# Patient Record
Sex: Female | Born: 1960
Health system: Southern US, Community
[De-identification: ages and names within clinical notes are randomized; demographics above are authoritative.]

## PROBLEM LIST (undated history)

## (undated) ENCOUNTER — Emergency Department (HOSPITAL_BASED_OUTPATIENT_CLINIC_OR_DEPARTMENT_OTHER): Admission: EM | Payer: 59

## (undated) DIAGNOSIS — E785 Hyperlipidemia, unspecified: Secondary | ICD-10-CM

## (undated) DIAGNOSIS — G43909 Migraine, unspecified, not intractable, without status migrainosus: Secondary | ICD-10-CM

## (undated) DIAGNOSIS — C801 Malignant (primary) neoplasm, unspecified: Secondary | ICD-10-CM

## (undated) HISTORY — DX: Migraine, unspecified, not intractable, without status migrainosus: G43.909

## (undated) HISTORY — DX: Hyperlipidemia, unspecified: E78.5

## (undated) HISTORY — DX: Malignant (primary) neoplasm, unspecified: C80.1

---
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
## 1990-05-21 HISTORY — PX: ABDOMINAL HYSTERECTOMY: SHX81

## 1996-05-21 HISTORY — PX: WISDOM TOOTH EXTRACTION: SHX21

## 1998-05-21 HISTORY — PX: OTHER SURGICAL HISTORY: SHX169

## 2011-03-28 LAB — HM PAP SMEAR: HM Pap smear: NORMAL

## 2011-07-03 ENCOUNTER — Encounter: Payer: Self-pay | Admitting: Family

## 2011-07-03 ENCOUNTER — Ambulatory Visit (INDEPENDENT_AMBULATORY_CARE_PROVIDER_SITE_OTHER): Payer: 59 | Admitting: Family

## 2011-07-03 DIAGNOSIS — L258 Unspecified contact dermatitis due to other agents: Secondary | ICD-10-CM

## 2011-07-03 DIAGNOSIS — L853 Xerosis cutis: Secondary | ICD-10-CM

## 2011-07-03 DIAGNOSIS — N951 Menopausal and female climacteric states: Secondary | ICD-10-CM

## 2011-07-03 DIAGNOSIS — J3489 Other specified disorders of nose and nasal sinuses: Secondary | ICD-10-CM

## 2011-07-03 MED ORDER — HYDROCORTISONE 1 % EX OINT: TOPICAL_OINTMENT | Freq: Two times a day (BID) | CUTANEOUS | Status: AC | PRN

## 2011-07-03 NOTE — Patient Instructions (Signed)
Please schedule a fasting physical at your convenience.  

## 2011-07-03 NOTE — Progress Notes (Signed)
Subjective:    Patient ID: Erica Diaz, female    DOB: [birth year], 51 y.o.   MRN: [id number]  HPI  Erica Diaz is a 51 yr old female who presents today to establish care.  1) Hoarse-  She reports clear nasal drainage.  Notes nasal dryness.  Denies cough.  Feels irritating.  She has been using zyrtec if some improvement.    2) Rash-  She thinks that it is "eczema."  She gets it on her trunk, legs, back.  She has been using dove unscented.  Bath oil, cetaphil cream.    3) Menopausal syndrome- reports partial hysterectomy.  She was reports that she started estrogen back in September per her GYN.   Denies any significant pmhx.    Review of Systems  Constitutional: Negative for unexpected weight change.  HENT: Negative for hearing loss.   Eyes: Negative for visual disturbance.  Respiratory: Negative for shortness of breath.   Cardiovascular: Negative for chest pain.  Gastrointestinal: Negative for nausea, vomiting and diarrhea.  Genitourinary: Negative for dysuria, frequency and hematuria.  Musculoskeletal: Negative for myalgias and arthralgias.  Skin:       See HPI- rash, denies moles   Neurological: Negative for headaches.  Hematological: Negative for adenopathy.  Psychiatric/Behavioral:       Denies depression/anxiety   Past Medical History  Diagnosis Date  . 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 Hyperlipidemia   . Migraines   . Endometriosis     History   Social History  . Marital Status: Married    Spouse Name: N/A    Number of Children: 1  . Years of Education: N/A   Occupational History  .  [employer]   Social History Main Topics  . Smoking status: Former Smoker -- 8 years    Quit date: 05/21/1990  . Smokeless tobacco: Never Used  . Alcohol Use: No  . Drug Use: Not on file  . Sexually Active: Not on file   Other Topics Concern  . Not on file   Social History Narrative   Caffeine:  1 cup coffee dailyRegular exercise:  Active work life. (walks  a lot on job)1 biological child, 2 step childrenWorks in environmental services at [employer], One daughter, 2 step daughters.     Past Surgical History  Procedure Date  . Abdominal hysterectomy 05/21/90    partial hysterectomy. Has right ovary  . Ear surgery 2000    repair of R TM  . Wisdom tooth extraction 1998    Family History  Problem Relation Age of Onset  . Diabetes Mother   . Heart disease Mother   . Diabetes Maternal Grandmother     Allergies  Allergen Reactions  . Thorazine (Chlorpromazine Hcl) Other (See Comments)    Neurologic disturbance  . Codeine Itching  . Sulfa Drugs Cross Reactors Hives    No current outpatient prescriptions on file prior to visit.    BP 124/70  Pulse 63  Temp(Src) 97.6 F (36.4 C) (Oral)  Resp 16  Ht 5' 1.5" (1.562 m)  Wt 146 lb 1.9 oz (66.28 kg)  BMI 27.16 kg/m2  SpO2 99%        Objective:   Physical Exam  Constitutional: She is oriented to person, place, and time. She appears well-developed and well-nourished. No distress.  HENT:  Head: Normocephalic and atraumatic.  Right Ear: Tympanic membrane and ear canal normal.  Left Ear: Tympanic membrane and ear canal normal.  Mouth/Throat:  No posterior oropharyngeal edema or posterior oropharyngeal erythema.  Eyes: No scleral icterus.  Neck: Neck supple.  Cardiovascular: Normal rate and regular rhythm.   No murmur heard. Pulmonary/Chest: Effort normal and breath sounds normal. No respiratory distress. She has no wheezes. She has no rales. She exhibits no tenderness.  Musculoskeletal: She exhibits no edema.  Lymphadenopathy:    She has no cervical adenopathy.  Neurological: She is alert and oriented to person, place, and time. Coordination normal.  Skin: Skin is warm and dry. No erythema.       Few hyperpigmented patches on lower back, but no other rashes noted.   Psychiatric: She has a normal mood and affect. Her behavior is normal. Judgment and thought content normal.           Assessment & Plan:

## 2011-07-04 DIAGNOSIS — N951 Menopausal and female climacteric states: Secondary | ICD-10-CM | POA: Insufficient documentation

## 2011-07-04 DIAGNOSIS — J3489 Other specified disorders of nose and nasal sinuses: Secondary | ICD-10-CM | POA: Insufficient documentation

## 2011-07-04 DIAGNOSIS — L853 Xerosis cutis: Secondary | ICD-10-CM | POA: Insufficient documentation

## 2011-07-04 NOTE — Assessment & Plan Note (Signed)
Hopefully, the humidifier will help her skin as well as her nasal mucosa.  Recommended a good moisturizer such as lubriderm.  Can try OTC hydrocortisone prn severe itching.

## 2011-07-04 NOTE — Assessment & Plan Note (Signed)
I recommended that she start running a humidifier in her room.  Also, recommended that she use nasal saline spray several times a day.

## 2011-07-04 NOTE — Assessment & Plan Note (Signed)
Reports improvement in her symptoms since she started estrogen.  She wishes to have [place] continue her estrogen refills and I have told her that I will do that.

## 2011-07-18 ENCOUNTER — Telehealth: Payer: Self-pay | Admitting: *Deleted

## 2011-07-18 NOTE — Telephone Encounter (Signed)
Received records from [facility] and forwarded to Provider for review.

## 2012-12-22 ENCOUNTER — Emergency Department (HOSPITAL_COMMUNITY)
Admission: EM | Admit: 2012-12-22 | Discharge: 2012-12-22 | Disposition: A | Discharge status: Home / Self Care | Payer: 59 | Source: Home / Self Care

## 2012-12-22 ENCOUNTER — Encounter (HOSPITAL_COMMUNITY): Payer: Self-pay | Admitting: Emergency Medicine

## 2012-12-22 DIAGNOSIS — B309 Viral conjunctivitis, unspecified: Secondary | ICD-10-CM

## 2012-12-22 MED ORDER — TOBRAMYCIN 0.3 % OP SOLN: 1.0000 [drp] | OPHTHALMIC | Status: DC

## 2012-12-22 NOTE — ED Provider Notes (Signed)
Erica Diaz is a 52 y.o. female who presents to Urgent Care today for bilateral eye irration and redness starting this morning. Patient also notes nasal congestion. She denies any eye pain or blurry vision. She denies any fevers or chills not tried any medications yet. She feels well otherwise. No recent sick contacts.   PMH reviewed. Significant for cervical cancer History  Substance Use Topics  . Smoking status: Former Smoker -- 8 years    Quit date: 05/21/1990  . Smokeless tobacco: Never Used  . Alcohol Use: No   ROS as above Medications reviewed. No current facility-administered medications for this encounter.   Current Outpatient Prescriptions  Medication Sig Dispense Refill  . estrogens conjugated, synthetic A, (CENESTIN) 0.625 MG tablet Take 0.625 mg by mouth daily.      Marland Kitchen tobramycin (TOBREX) 0.3 % ophthalmic solution Place 1 drop into both eyes every 4 (four) hours.  5 mL  0    Exam:  BP 132/68  Pulse 72  Temp(Src) 98.1 F (36.7 C) (Oral)  Resp 18  SpO2 98% Gen: Well NAD HEENT: EOMI,  MMM, bilateral conjunctival injection with mild discharge. PERRLA  No results found for this or any previous visit (from the past 24 hour(s)). No results found.  Assessment and Plan: 52 y.o. female with bilateral conjunctivitis. Likely viral. Plan for artificial tears. Additionally use tobramycin eyedrops for the possibility of bacterial conjunctivitis. Work note. Discussed warning signs or symptoms. Please see discharge instructions. Patient expresses understanding.      Rodolph Bong, MD 12/22/12 [phone]

## 2012-12-22 NOTE — ED Notes (Signed)
Reports runny nose and bilateral eye itchiness and redness started today.

## 2013-08-11 ENCOUNTER — Ambulatory Visit (INDEPENDENT_AMBULATORY_CARE_PROVIDER_SITE_OTHER): Payer: 59 | Admitting: Family

## 2013-08-11 ENCOUNTER — Encounter: Payer: Self-pay | Admitting: Family

## 2013-08-11 VITALS — BP 102/70 | HR 75 | Temp 97.8°F | Ht 61.25 in | Wt 140.0 lb

## 2013-08-11 DIAGNOSIS — J329 Chronic sinusitis, unspecified: Secondary | ICD-10-CM | POA: Insufficient documentation

## 2013-08-11 MED ORDER — FLUTICASONE PROPIONATE 50 MCG/ACT NA SUSP: 2.0000 | Freq: Every day | NASAL | Status: DC

## 2013-08-11 MED ORDER — AMOXICILLIN 500 MG PO CAPS: 500.0000 mg | ORAL_CAPSULE | Freq: Three times a day (TID) | ORAL | Status: DC

## 2013-08-11 NOTE — Assessment & Plan Note (Signed)
Pt with chronic allergic rhinitis.  Now with superimposed sinusitis.   Start amoxicillin and flonase. Call if symptoms worsen or if symptoms are not improved in 1 week.

## 2013-08-11 NOTE — Progress Notes (Signed)
Subjective:    Patient ID: Erica Diaz, female    DOB: [birth year], 53 y.o.   MRN: [id number]  HPI  Erica Diaz is a 53 yr old female who presents today for follow up. She was last seen in February 2013.  She presents today with chief complaint of sinus drainage.  Reports that sinus drainage has been present x 3 weeks. Reports that she is using claritin with minimal improvement.  Reports nasal drainage is clear to bloody. Reports dry cough.  Denies associated fever. Feels like allergies.  + facial pressure, + malaise/fatigue.    Review of Systems    see HPI  Past Medical History  Diagnosis Date  . 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 Hyperlipidemia   . Migraines   . Endometriosis     History   Social History  . Marital Status: Married    Spouse Name: N/A    Number of Children: 1  . Years of Education: N/A   Occupational History  .  [employer]   Social History Main Topics  . Smoking status: Former Smoker -- 8 years    Quit date: 05/21/1990  . Smokeless tobacco: Never Used  . Alcohol Use: No  . Drug Use: Not on file  . Sexual Activity: Not on file   Other Topics Concern  . Not on file   Social History Narrative   Caffeine:  1 cup coffee daily   Regular exercise:  Active work life. (walks a lot on job)   1 biological child, 2 step children   Works in Water engineer at [employer]   Married,    One daughter, 2 step daughters.                 Past Surgical History  Procedure Laterality Date  . Abdominal hysterectomy  05/21/90    partial hysterectomy. Has right ovary  . Ear surgery  2000    repair of R TM  . Wisdom tooth extraction  1998    Family History  Problem Relation Age of Onset  . Diabetes Mother   . Heart disease Mother   . Heart disease Father 35  . Diabetes Maternal Grandfather     Allergies  Allergen Reactions  . Thorazine [Chlorpromazine Hcl] Other (See Comments)    Neurologic disturbance  . Codeine Itching  . Sulfa Drugs  Cross Reactors Hives    No current outpatient prescriptions on file prior to visit.   No current facility-administered medications on file prior to visit.    BP 102/70  Pulse 75  Temp(Src) 97.8 F (36.6 C) (Oral)  Ht 5' 1.25" (1.556 m)  Wt 140 lb (63.504 kg)  BMI 26.23 kg/m2  SpO2 98%    Objective:   Physical Exam  Constitutional: She is oriented to person, place, and time. She appears well-developed and well-nourished. No distress.  HENT:  Head: Normocephalic and atraumatic.  Right Ear: Tympanic membrane and ear canal normal.  Left Ear: Tympanic membrane and ear canal normal.  Mouth/Throat: No oropharyngeal exudate, posterior oropharyngeal edema or posterior oropharyngeal erythema.  R frontal and maxillary sinus tenderness to palpation  Cardiovascular: Normal rate and regular rhythm.   No murmur heard. Pulmonary/Chest: Effort normal and breath sounds normal. No respiratory distress. She has no wheezes. She has no rales. She exhibits no tenderness.  Neurological: She is alert and oriented to person, place, and time.  Psychiatric: She has a normal mood and affect. Her behavior is  normal. Judgment and thought content normal.          Assessment & Plan:

## 2013-08-11 NOTE — Patient Instructions (Addendum)
Please schedule complete physical at your convenience.  Start amoxicillin and flonase. Call if symptoms worsen or if symptoms are not improved in 1 week.

## 2013-08-11 NOTE — Progress Notes (Signed)
Pre visit review using our clinic review tool, if applicable. No additional management support is needed unless otherwise documented below in the visit note. 

## 2013-08-25 ENCOUNTER — Telehealth: Payer: Self-pay | Admitting: *Deleted

## 2013-08-25 MED ORDER — FLUCONAZOLE 150 MG PO TABS: ORAL_TABLET | ORAL | Status: DC

## 2013-08-25 NOTE — Telephone Encounter (Signed)
Left detailed message on home # re: rx completion and to call if any questions.

## 2013-08-25 NOTE — Telephone Encounter (Signed)
Rx sent 

## 2013-08-25 NOTE — Telephone Encounter (Signed)
Pt left message that she was recently prescribed amoxicillin for sinus infection and thinks she now has a yeast infection. Pt is requesting med be called in for this. Please advise.

## 2013-09-22 ENCOUNTER — Ambulatory Visit (INDEPENDENT_AMBULATORY_CARE_PROVIDER_SITE_OTHER): Payer: 59 | Admitting: Family

## 2013-09-22 ENCOUNTER — Encounter: Payer: Self-pay | Admitting: Family

## 2013-09-22 VITALS — BP 98/70 | HR 77 | Temp 98.4°F | Resp 16 | Ht 61.25 in | Wt 141.0 lb

## 2013-09-22 DIAGNOSIS — J40 Bronchitis, not specified as acute or chronic: Secondary | ICD-10-CM | POA: Insufficient documentation

## 2013-09-22 DIAGNOSIS — J209 Acute bronchitis, unspecified: Secondary | ICD-10-CM

## 2013-09-22 MED ORDER — AZITHROMYCIN 250 MG PO TABS: ORAL_TABLET | ORAL | Status: DC

## 2013-09-22 MED ORDER — BENZONATATE 100 MG PO CAPS: 100.0000 mg | ORAL_CAPSULE | Freq: Three times a day (TID) | ORAL | Status: DC | PRN

## 2013-09-22 NOTE — Assessment & Plan Note (Signed)
Will rx with zpak and tessalon. Follow up if symptoms worsen or if symptoms do not improve.

## 2013-09-22 NOTE — Progress Notes (Signed)
Pre visit review using our clinic review tool, if applicable. No additional management support is needed unless otherwise documented below in the visit note. 

## 2013-09-22 NOTE — Patient Instructions (Signed)
Start zithromax and tessalon for cough/bronchitis. Continue claritin and flonase. Call if symptoms worsen, or if not improved in 3 days.

## 2013-09-22 NOTE — Progress Notes (Signed)
   Subjective:    Patient ID: TROI BECHTOLD, female    DOB: 12-31-1960, 53 y.o.   MRN: [id number]  HPI  Ms. Holleman is a 53 yr old female who presents today with chief complaint of cough. Reports feeling "rough" for 1 week.  Cough worsened last night. Cough is productive.  Cough worsened when she was outside and her neighbor was mowing the lawn.  She denies associated fever.  Cough is productive at times of white/yellow sputum.  She reports that she has tried otc claritin, flonase, alka selzer plus night/day. No significant improvement with these measures.   Review of Systems     Objective:   Physical Exam  Constitutional: She is oriented to person, place, and time. She appears well-developed and well-nourished. No distress.  HENT:  Head: Normocephalic and atraumatic.  Right Ear: Tympanic membrane and ear canal normal.  Left Ear: Tympanic membrane and ear canal normal.  Mouth/Throat: No oropharyngeal exudate, posterior oropharyngeal edema or posterior oropharyngeal erythema.  Cardiovascular: Normal rate and regular rhythm.   No murmur heard. Pulmonary/Chest: Effort normal and breath sounds normal. No respiratory distress. She has no wheezes. She has no rales. She exhibits no tenderness.  Musculoskeletal: She exhibits no edema.  Neurological: She is alert and oriented to person, place, and time.  Psychiatric: She has a normal mood and affect. Her behavior is normal. Judgment and thought content normal.          Assessment & Plan:

## 2013-09-23 ENCOUNTER — Telehealth: Payer: Self-pay | Admitting: *Deleted

## 2013-09-23 NOTE — Telephone Encounter (Signed)
Pt left message that she was in the office yesterday and would like someone to call her back.  Attempted to reach pt and left message to return my call.

## 2013-09-25 NOTE — Telephone Encounter (Signed)
Left message on home # for pt to return my call. 

## 2014-06-01 ENCOUNTER — Telehealth: Payer: Self-pay | Admitting: Family

## 2014-06-01 NOTE — Telephone Encounter (Signed)
I am happy to refer her for a colonoscopy, however this is usually assessed and ordered as part of a cpx. She is due for this. Lets get her scheduled and will assess and order at that time.

## 2014-06-01 NOTE — Telephone Encounter (Signed)
Caller name: Shalaina Relation to pt: self Call back number: [phone] Pharmacy:  Reason for call:   Patient is requesting to have a colonoscopy and would like to be referred. She states that she is not having any problems.

## 2014-06-01 NOTE — Telephone Encounter (Signed)
Notified pt and scheduled cpe for 06/10/14 at 10am.

## 2014-06-10 ENCOUNTER — Encounter: Payer: Self-pay | Admitting: Family

## 2014-06-10 ENCOUNTER — Ambulatory Visit (INDEPENDENT_AMBULATORY_CARE_PROVIDER_SITE_OTHER): Payer: 59 | Admitting: Family

## 2014-06-10 VITALS — BP 126/70 | HR 62 | Temp 98.0°F | Resp 14 | Ht 61.0 in | Wt 139.4 lb

## 2014-06-10 DIAGNOSIS — Z0000 Encounter for general adult medical examination without abnormal findings: Secondary | ICD-10-CM | POA: Insufficient documentation

## 2014-06-10 DIAGNOSIS — Z780 Asymptomatic menopausal state: Secondary | ICD-10-CM

## 2014-06-10 DIAGNOSIS — R946 Abnormal results of thyroid function studies: Secondary | ICD-10-CM

## 2014-06-10 NOTE — Patient Instructions (Addendum)
Please schedule lab visit in 3 months so that we can repeat your thyroid testing.   You will be contacted about your bone density and colonoscopy. Follow up in 1 year, sooner if problems/concerns.

## 2014-06-10 NOTE — Progress Notes (Signed)
Subjective:    Patient ID: Erica Diaz, female    DOB: [birth year], 54 y.o.   MRN: [id number]  HPI Ms. Ganson is here today for CPE. 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 Colonoscopy: will set up Dexa: will set up Pap Smear: 04/2014, normal Mammogram: 03/2014, normal  Reports feeling "sluggish".  She feels it is related to work being so busy. Reports sleep is "OK". Has always had trouble staying asleep. She is unsure if she snores.  Review of Systems  Constitutional: Negative for fever, chills, activity change and fatigue.  HENT: Positive for rhinorrhea and sinus pressure.        Reports some clear nasal drainage.  Respiratory: Negative for chest tightness and shortness of breath.   Cardiovascular: Negative for chest pain, palpitations and leg swelling.  Gastrointestinal: Negative for abdominal pain, diarrhea, constipation and blood in stool.  Endocrine: Negative for cold intolerance and heat intolerance.       Reports hot flashes- premarin is very helpful.  Genitourinary: Negative for urgency, frequency and difficulty urinating.  Skin: Negative for rash.  Neurological: Positive for headaches.       Occasional headaches- tylenol helps.  Hematological: Negative for adenopathy.  Denies depression,anxiety.  Past Medical History  Diagnosis Date  . 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 Hyperlipidemia   . Migraines   . Endometriosis     History   Social History  . Marital Status: Married    Spouse Name: N/A    Number of Children: 1  . Years of Education: N/A   Occupational History  .  [employer]   Social History Main Topics  . Smoking status: Former Smoker -- 8 years    Quit date: 05/21/1990  . Smokeless tobacco: Never Used  . Alcohol Use: No  . Drug Use: Not on file  . Sexual Activity: Not on file   Other Topics Concern  . Not on file   Social History Narrative   Caffeine:  1 cup coffee daily   Regular exercise:  Active work life. (walks a lot on job)   1 biological child, 2 step children   Works in Water engineer at [employer]   Married,    One daughter, 2 step daughters.                 Past Surgical History  Procedure Laterality Date  . Abdominal hysterectomy  05/21/90    partial hysterectomy. Has right ovary  . Ear surgery  2000    repair of R TM  . Wisdom tooth extraction  1998    Family History  Problem Relation Age of Onset  . Diabetes Mother   . Heart disease Mother   . Heart disease Father 6  . Diabetes Maternal Grandfather     Allergies  Allergen Reactions  . Thorazine [Chlorpromazine Hcl] Other (See Comments)    Neurologic disturbance  . Codeine Itching  . Sulfa Drugs Cross Reactors Hives    Current Outpatient Prescriptions on File Prior to Visit  Medication Sig Dispense Refill  . estrogens, conjugated, (PREMARIN) 0.625 MG tablet Take 0.625 mg by mouth daily. Take daily for 21 days then do not take for 7 days.    . fluticasone (FLONASE) 50 MCG/ACT nasal spray Place 2 sprays into both nostrils daily. (Patient taking differently: Place 2 sprays into both nostrils daily as needed. ) 16 g 3  .  loratadine (CLARITIN) 10 MG tablet Take 10 mg by mouth daily as needed.      No current facility-administered medications on file prior to visit.    BP 126/70 mmHg  Pulse 62  Temp(Src) 98 F (36.7 C) (Oral)  Resp 14  Ht 5\' 1"  (1.549 m)  Wt 139 lb 6.4 oz (63.231 kg)  BMI 26.35 kg/m2  SpO2 99%      Objective:   Physical Exam  Physical Exam  Constitutional: She is oriented to person, place, and time. She appears well-developed and well-nourished. No distress.  HENT:  Head: Normocephalic and atraumatic.  Right Ear: Tympanic membrane and ear canal normal.  Left Ear: Tympanic membrane and ear canal normal.  Mouth/Throat: Oropharynx is clear and moist.  Eyes: Pupils are equal, round, and reactive to light. No scleral  icterus.  Neck: Normal range of motion. No thyromegaly present.  Cardiovascular: Normal rate and regular rhythm.   No murmur heard. Pulmonary/Chest: Effort normal and breath sounds normal. No respiratory distress. He has no wheezes. She has no rales. She exhibits no tenderness.  Abdominal: Soft. Bowel sounds are normal. He exhibits no distension and no mass. There is no tenderness. There is no rebound and no guarding.  Musculoskeletal: She exhibits no edema.  Lymphadenopathy:    She has no cervical adenopathy.  Neurological: She is alert and oriented to person, place, and time. She has normal reflexes. She exhibits normal muscle tone. Coordination normal.  Skin: Skin is warm and dry.  Psychiatric: She has a normal mood and affect. Her behavior is normal. Judgment and thought content normal.       Assessment & Plan:        Assessment & Plan:  Patient seen along with Dawn Whitmire NP-student.  I have personally seen and examined patient and agree with Ms. Whitmire's assessment and plan- [name] NP

## 2014-06-10 NOTE — Assessment & Plan Note (Addendum)
Doing very well. Will refer for bone density and colonoscopy. Immunizations up to date.  mammo and pap up to date.

## 2014-07-22 ENCOUNTER — Encounter: Payer: Self-pay | Admitting: Gastroenterology

## 2014-09-16 ENCOUNTER — Ambulatory Visit (AMBULATORY_SURGERY_CENTER): Payer: Self-pay | Admitting: *Deleted

## 2014-09-16 VITALS — Ht 61.0 in | Wt 142.0 lb

## 2014-09-16 DIAGNOSIS — Z1211 Encounter for screening for malignant neoplasm of colon: Secondary | ICD-10-CM

## 2014-09-16 MED ORDER — NA SULFATE-K SULFATE-MG SULF 17.5-3.13-1.6 GM/177ML PO SOLN: 1.0000 | Freq: Once | ORAL | Status: DC

## 2014-09-16 NOTE — Progress Notes (Signed)
Denies allergies to eggs or soy products. Denies complications with sedation or anesthesia. Denies O2 use. Denies use of diet or weight loss medications.  Emmi instructions given for colonoscopy.  

## 2014-09-27 ENCOUNTER — Encounter: Payer: 59 | Admitting: Gastroenterology

## 2014-10-08 ENCOUNTER — Encounter: Payer: Self-pay | Admitting: Gastroenterology

## 2014-10-08 ENCOUNTER — Ambulatory Visit (AMBULATORY_SURGERY_CENTER): Payer: 59 | Admitting: Gastroenterology

## 2014-10-08 VITALS — BP 116/60 | HR 51 | Temp 98.0°F | Resp 16 | Ht 61.0 in | Wt 142.0 lb

## 2014-10-08 DIAGNOSIS — Z1211 Encounter for screening for malignant neoplasm of colon: Secondary | ICD-10-CM | POA: Diagnosis not present

## 2014-10-08 MED ORDER — SODIUM CHLORIDE 0.9 % IV SOLN: 500.0000 mL | INTRAVENOUS | Status: DC

## 2014-10-08 NOTE — Progress Notes (Signed)
Transferred to PACU.  NAD. Awake and alert.  Report to Opal Sidles, Therapist, sports.

## 2014-10-08 NOTE — Patient Instructions (Signed)
YOU HAD AN ENDOSCOPIC PROCEDURE TODAY AT THE [facility]:   Refer to the procedure report that was given to you for any specific questions about what was found during the examination.  If the procedure report does not answer your questions, please call your gastroenterologist to clarify.  If you requested that your care partner not be given the details of your procedure findings, then the procedure report has been included in a sealed envelope for you to review at your convenience later.  YOU SHOULD EXPECT: Some feelings of bloating in the abdomen. Passage of more gas than usual.  Walking can help get rid of the air that was put into your GI tract during the procedure and reduce the bloating. If you had a lower endoscopy (such as a colonoscopy or flexible sigmoidoscopy) you may notice spotting of blood in your stool or on the toilet paper. If you underwent a bowel prep for your procedure, you may not have a normal bowel movement for a few days.  Please Note:  You might notice some irritation and congestion in your nose or some drainage.  This is from the oxygen used during your procedure.  There is no need for concern and it should clear up in a day or so.  SYMPTOMS TO REPORT IMMEDIATELY:   Following lower endoscopy (colonoscopy or flexible sigmoidoscopy):  Excessive amounts of blood in the stool  Significant tenderness or worsening of abdominal pains  Swelling of the abdomen that is new, acute  Fever of 100F or higher   For urgent or emergent issues, a gastroenterologist can be reached at any hour by calling [phone].   DIET: Your first meal following the procedure should be a small meal and then it is ok to progress to your normal diet. Heavy or fried foods are harder to digest and may make you feel nauseous or bloated.  Likewise, meals heavy in dairy and vegetables can increase bloating.  Drink plenty of fluids but you should avoid alcoholic beverages for 24  hours.  ACTIVITY:  You should plan to take it easy for the rest of today and you should NOT DRIVE or use heavy machinery until tomorrow (because of the sedation medicines used during the test).    FOLLOW UP: Our staff will call the number listed on your records the next business day following your procedure to check on you and address any questions or concerns that you may have regarding the information given to you following your procedure. If we do not reach you, we will leave a message.  However, if you are feeling well and you are not experiencing any problems, there is no need to return our call.  We will assume that you have returned to your regular daily activities without incident.  If any biopsies were taken you will be contacted by phone or by letter within the next 1-3 weeks.  Please call us at [phone] if you have not heard about the biopsies in 3 weeks.    SIGNATURES/CONFIDENTIALITY: You and/or your care partner have signed paperwork which will be entered into your electronic medical record.  These signatures attest to the fact that that the information above on your After Visit Summary has been reviewed and is understood.  Full responsibility of the confidentiality of this discharge information lies with you and/or your care-partner. 

## 2014-10-08 NOTE — Op Note (Signed)
[place]  [address]. [place], [address]   COLONOSCOPY PROCEDURE REPORT  PATIENT: Erica Diaz, Erica Diaz  MR#: [id number] BIRTHDATE: Sep 19, 1960 , 47  yrs. old GENDER: female ENDOSCOPIST: Ladene Artist, MD, [facility] REFERRED FX:[id number] Inda Castle, FNP PROCEDURE DATE:  10/08/2014 PROCEDURE:   Colonoscopy, screening First Screening Colonoscopy - Avg.  risk and is 50 yrs.  old or older Yes.  Prior Negative Screening - Now for repeat screening. N/A  History of Adenoma - Now for follow-up colonoscopy & has been > or = to 3 yrs.  N/A  Polyps removed today? No Recommend repeat exam, <10 yrs? No ASA CLASS:   Class II INDICATIONS:Screening for colonic neoplasia and Colorectal Neoplasm Risk Assessment for this procedure is average risk. MEDICATIONS: Monitored anesthesia care and Propofol 140 mg IV DESCRIPTION OF PROCEDURE:   After the risks benefits and alternatives of the procedure were thoroughly explained, informed consent was obtained.  The digital rectal exam revealed no abnormalities of the rectum.   The     endoscope was introduced through the anus and advanced to the cecum, which was identified by both the appendix and ileocecal valve. No adverse events experienced.   The quality of the prep was excellent.  (Suprep was used)  The instrument was then slowly withdrawn as the colon was fully examined. Estimated blood loss is zero unless otherwise noted in this procedure report.    COLON FINDINGS: A normal appearing cecum, ileocecal valve, and appendiceal orifice were identified.  The ascending, transverse, descending, sigmoid colon, and rectum appeared unremarkable. Retroflexed views revealed no abnormalities. The time to cecum = 3.2 Withdrawal time = 10.0   The scope was withdrawn and the procedure completed. COMPLICATIONS: There were no immediate complications.  ENDOSCOPIC IMPRESSION: Normal colonoscopy  RECOMMENDATIONS: Continue current colorectal screening  recommendations for "routine risk" patients with a repeat colonoscopy in 10 years.  eSigned:  Ladene Artist, MD, [facility] 10/08/2014 1:52 PM   y]

## 2014-10-11 ENCOUNTER — Telehealth: Payer: Self-pay

## 2014-10-11 NOTE — Telephone Encounter (Signed)
  Follow up Call-     Patient questions:  Do you have a fever, pain , or abdominal swelling? No. Pain Score  0 *  Have you tolerated food without any problems? Yes.    Have you been able to return to your normal activities? Yes.    Do you have any questions about your discharge instructions: Diet   No. Medications  No. Follow up visit  No.  Do you have questions or concerns about your Care? No.  Actions: * If pain score is 4 or above: No action needed, pain <4.  

## 2015-06-02 MED FILL — PREMARIN 0.625 MG TABLET: 0.625 | 30 days supply | Qty: 30 | Fill #0

## 2015-07-05 MED FILL — PREMARIN 0.625 MG TABLET: 0.625 | 30 days supply | Qty: 30 | Fill #1

## 2015-07-10 DIAGNOSIS — L309 Dermatitis, unspecified: Secondary | ICD-10-CM | POA: Diagnosis not present

## 2015-07-10 DIAGNOSIS — R05 Cough: Secondary | ICD-10-CM | POA: Diagnosis not present

## 2015-07-10 DIAGNOSIS — J069 Acute upper respiratory infection, unspecified: Secondary | ICD-10-CM | POA: Diagnosis not present

## 2015-08-03 MED FILL — PREMARIN 0.625 MG TABLET: 0.625 | 30 days supply | Qty: 30 | Fill #2

## 2015-09-05 MED FILL — PREMARIN 0.625 MG TABLET: 0.625 | 30 days supply | Qty: 30 | Fill #3

## 2015-10-04 DIAGNOSIS — N6489 Other specified disorders of breast: Secondary | ICD-10-CM | POA: Diagnosis not present

## 2015-10-04 DIAGNOSIS — N6002 Solitary cyst of left breast: Secondary | ICD-10-CM | POA: Diagnosis not present

## 2015-10-05 MED FILL — PREMARIN 0.625 MG TABLET: 0.625 | 30 days supply | Qty: 30 | Fill #4

## 2015-10-11 DIAGNOSIS — J40 Bronchitis, not specified as acute or chronic: Secondary | ICD-10-CM | POA: Diagnosis not present

## 2015-10-11 DIAGNOSIS — J0100 Acute maxillary sinusitis, unspecified: Secondary | ICD-10-CM | POA: Diagnosis not present

## 2015-11-03 MED FILL — PREMARIN 0.625 MG TABLET: 0.625 | 30 days supply | Qty: 30 | Fill #5

## 2015-11-28 DIAGNOSIS — Z299 Encounter for prophylactic measures, unspecified: Secondary | ICD-10-CM | POA: Diagnosis not present

## 2015-11-28 DIAGNOSIS — H66001 Acute suppurative otitis media without spontaneous rupture of ear drum, right ear: Secondary | ICD-10-CM | POA: Diagnosis not present

## 2015-11-28 DIAGNOSIS — H6121 Impacted cerumen, right ear: Secondary | ICD-10-CM | POA: Diagnosis not present

## 2015-12-05 MED FILL — PREMARIN 0.625 MG TABLET: 0.625 | 30 days supply | Qty: 30 | Fill #6

## 2016-01-03 MED FILL — PREMARIN 0.625 MG TABLET: 0.625 | 30 days supply | Qty: 30 | Fill #7

## 2016-01-31 MED FILL — PREMARIN 0.625 MG TABLET: 0.625 | 30 days supply | Qty: 30 | Fill #8

## 2016-03-08 MED FILL — PREMARIN 0.625 MG TABLET: 0.625 | 30 days supply | Qty: 30 | Fill #9

## 2016-04-06 MED FILL — PREMARIN 0.625 MG TABLET: 0.625 | 30 days supply | Qty: 30 | Fill #10

## 2016-05-03 DIAGNOSIS — J019 Acute sinusitis, unspecified: Secondary | ICD-10-CM | POA: Diagnosis not present

## 2016-05-03 DIAGNOSIS — J209 Acute bronchitis, unspecified: Secondary | ICD-10-CM | POA: Diagnosis not present

## 2016-05-07 MED FILL — PREMARIN 0.625 MG TABLET: 0.625 | 30 days supply | Qty: 30 | Fill #11

## 2016-05-29 DIAGNOSIS — N6019 Diffuse cystic mastopathy of unspecified breast: Secondary | ICD-10-CM | POA: Diagnosis not present

## 2016-05-29 DIAGNOSIS — R922 Inconclusive mammogram: Secondary | ICD-10-CM | POA: Diagnosis not present

## 2016-06-13 DIAGNOSIS — Z01419 Encounter for gynecological examination (general) (routine) without abnormal findings: Secondary | ICD-10-CM | POA: Diagnosis not present

## 2016-06-13 MED FILL — PREMARIN 0.45 MG TABLET: 0.45 | 30 days supply | Qty: 30 | Fill #0

## 2016-07-11 MED FILL — PREMARIN 0.45 MG TABLET: 0.45 | 30 days supply | Qty: 30 | Fill #1

## 2016-08-08 MED FILL — PREMARIN 0.45 MG TABLET: 0.45 | 30 days supply | Qty: 30 | Fill #2

## 2016-09-03 MED FILL — PREMARIN 0.45 MG TABLET: 0.45 | 30 days supply | Qty: 30 | Fill #3

## 2016-10-05 MED FILL — PREMARIN 0.45 MG TABLET: 0.45 | 30 days supply | Qty: 30 | Fill #4

## 2016-10-07 DIAGNOSIS — J018 Other acute sinusitis: Secondary | ICD-10-CM | POA: Diagnosis not present

## 2016-11-06 MED FILL — PREMARIN 0.45 MG TABLET: 0.45 | 30 days supply | Qty: 30 | Fill #5

## 2016-12-05 MED FILL — PREMARIN 0.45 MG TABLET: 0.45 | 30 days supply | Qty: 30 | Fill #6

## 2017-01-04 MED FILL — PREMARIN 0.45 MG TABLET: 0.45 | 30 days supply | Qty: 30 | Fill #7

## 2017-01-28 MED FILL — PREMARIN 0.45 MG TABLET: 0.45 | 30 days supply | Qty: 30 | Fill #8

## 2017-02-04 DIAGNOSIS — K8010 Calculus of gallbladder with chronic cholecystitis without obstruction: Secondary | ICD-10-CM | POA: Diagnosis not present

## 2017-03-04 MED FILL — PREMARIN 0.45 MG TABLET: 0.45 | 30 days supply | Qty: 30 | Fill #9

## 2017-04-03 MED FILL — PREMARIN 0.45 MG TABLET: 0.45 | 30 days supply | Qty: 30 | Fill #10

## 2017-05-05 MED FILL — PREMARIN 0.45 MG TABLET: 0.45 | 30 days supply | Qty: 30 | Fill #11

## 2017-05-22 DIAGNOSIS — L089 Local infection of the skin and subcutaneous tissue, unspecified: Secondary | ICD-10-CM | POA: Diagnosis not present

## 2017-05-22 DIAGNOSIS — S60451A Superficial foreign body of left index finger, initial encounter: Secondary | ICD-10-CM | POA: Diagnosis not present

## 2017-05-23 DIAGNOSIS — M79646 Pain in unspecified finger(s): Secondary | ICD-10-CM | POA: Diagnosis not present

## 2017-06-02 MED FILL — PREMARIN 0.45 MG TABLET: 0.45 | 30 days supply | Qty: 30 | Fill #12

## 2017-06-06 DIAGNOSIS — Z1231 Encounter for screening mammogram for malignant neoplasm of breast: Secondary | ICD-10-CM | POA: Diagnosis not present

## 2017-06-12 ENCOUNTER — Telehealth: Payer: Self-pay | Admitting: *Deleted

## 2017-06-12 NOTE — Telephone Encounter (Signed)
Received Mammogram results from [facility]; forwarded to provider/SLS 01/23

## 2017-06-17 ENCOUNTER — Telehealth: Payer: Self-pay | Admitting: Family

## 2017-06-17 NOTE — Telephone Encounter (Signed)
Please let pt know that the radiologist would like her to complete some additional breast images for further evaluation. Let me know if she has not been contacted by them about a follow up appointment in 1 week.   

## 2017-06-17 NOTE — Telephone Encounter (Signed)
Patient has follow up appointment tomorrow morning 06/18/17 with imaging.

## 2017-06-18 DIAGNOSIS — Z79890 Hormone replacement therapy (postmenopausal): Secondary | ICD-10-CM | POA: Diagnosis not present

## 2017-06-18 DIAGNOSIS — Z01419 Encounter for gynecological examination (general) (routine) without abnormal findings: Secondary | ICD-10-CM | POA: Diagnosis not present

## 2017-06-18 DIAGNOSIS — Z5181 Encounter for therapeutic drug level monitoring: Secondary | ICD-10-CM | POA: Diagnosis not present

## 2017-06-18 DIAGNOSIS — R7989 Other specified abnormal findings of blood chemistry: Secondary | ICD-10-CM | POA: Diagnosis not present

## 2017-06-18 DIAGNOSIS — R928 Other abnormal and inconclusive findings on diagnostic imaging of breast: Secondary | ICD-10-CM | POA: Diagnosis not present

## 2017-06-18 LAB — HM HEPATITIS C SCREENING LAB: HM Hepatitis Screen: NEGATIVE

## 2017-06-21 ENCOUNTER — Encounter: Payer: Self-pay | Admitting: Family

## 2017-06-28 ENCOUNTER — Encounter: Payer: Self-pay | Admitting: Family

## 2017-07-02 MED FILL — PREMARIN 0.45 MG TABLET: 0.45 | 30 days supply | Qty: 30 | Fill #0

## 2017-07-09 ENCOUNTER — Ambulatory Visit (HOSPITAL_BASED_OUTPATIENT_CLINIC_OR_DEPARTMENT_OTHER)
Admission: RE | Admit: 2017-07-09 | Discharge: 2017-07-09 | Disposition: A | Discharge status: Home / Self Care | Payer: 59 | Source: Ambulatory Visit | Attending: Family | Admitting: Family

## 2017-07-09 ENCOUNTER — Encounter: Payer: Self-pay | Admitting: Family

## 2017-07-09 ENCOUNTER — Telehealth: Payer: Self-pay | Admitting: Family

## 2017-07-09 ENCOUNTER — Ambulatory Visit: Payer: 59 | Admitting: Family

## 2017-07-09 VITALS — BP 149/65 | HR 62 | Temp 97.5°F | Resp 16 | Ht 61.0 in | Wt 143.2 lb

## 2017-07-09 DIAGNOSIS — E0590 Thyrotoxicosis, unspecified without thyrotoxic crisis or storm: Secondary | ICD-10-CM

## 2017-07-09 LAB — TSH: TSH: <0.01 u[IU]/mL — ABNORMAL LOW (ref 0.35–4.50)

## 2017-07-09 LAB — T3, FREE: T3, Free: 4.5 pg/mL — ABNORMAL HIGH (ref 2.3–4.2)

## 2017-07-09 LAB — T4, FREE: FREE T4: 1.16 ng/dL (ref 0.60–1.60)

## 2017-07-09 NOTE — Patient Instructions (Signed)
Please complete lab work prior to leaving. Schedule ultrasound of your thyroid on the first floor.

## 2017-07-09 NOTE — Telephone Encounter (Signed)
Please contact patient and let her know that I reviewed her thyroid ultrasound.  Thyroid gland looks normal on ultrasound.  Her blood work however confirms overactive thyroid.  Results are similar to the results that she had at her GYN.  I would recommend that she see endocrinology.  Referral was placed today at her visit.

## 2017-07-09 NOTE — Progress Notes (Signed)
Subjective:    Patient ID: Erica Diaz, female    DOB: 1960-08-26, 57 y.o.   MRN: [id number]  HPI  Patient is a 57 yr old female here today to re-establish care. She has concern today about hyperthryoidism which was diagnosed by her GYN.  Review of her results in care everywhere reveal TSH <0.006 free T3 6.0.  This was performed 1 month ago.  Denies sweating, heat intolerance,diarrhea or weight loss. Denies swallowing issues.  Reports that her daughter has thyroid issues. Thinks that daughter has overactive thyroid.  Wt Readings from Last 3 Encounters:  07/09/17 143 lb 3.2 oz (65 kg)  10/08/14 142 lb (64.4 kg)  09/16/14 142 lb (64.4 kg)      Review of Systems See HPI  Past Medical History:  Diagnosis Date  . 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 Endometriosis   . Hyperlipidemia   . Migraines      Social History   Socioeconomic History  . Marital status: Married    Spouse name: Not on file  . Number of children: 1  . Years of education: Not on file  . Highest education level: Not on file  Social Needs  . Financial resource strain: Not on file  . Food insecurity - worry: Not on file  . Food insecurity - inability: Not on file  . Transportation needs - medical: Not on file  . Transportation needs - non-medical: Not on file  Occupational History    Employer: [employer]  Tobacco Use  . Smoking status: Former Smoker    Years: 8.00    Last attempt to quit: 05/21/1990    Years since quitting: 27.1  . Smokeless tobacco: Never Used  Substance and Sexual Activity  . Alcohol use: No    Alcohol/week: 0.0 oz  . Drug use: No  . Sexual activity: Not on file  Other Topics Concern  . Not on file  Social History Narrative   Caffeine:  1 cup coffee daily   Regular exercise:  Active work life. (walks a lot on job)   1 biological child, 2 step children   Works in Water engineer at [employer]   Married,    One daughter, 2 step daughters.                  Past Surgical History:  Procedure Laterality Date  . ABDOMINAL HYSTERECTOMY  05/21/90   partial hysterectomy. Has right ovary  . CESAREAN SECTION  1994  . ear surgery  2000   repair of R TM  . WISDOM TOOTH EXTRACTION  1998    Family History  Problem Relation Age of Onset  . Diabetes Mother   . Heart disease Mother   . Heart disease Father 38  . Diabetes Maternal Grandfather   . Colon cancer Neg Hx     Allergies  Allergen Reactions  . Thorazine [Chlorpromazine Hcl] Other (See Comments)    Neurologic disturbance  . Codeine Itching  . Sulfa Drugs Cross Reactors Hives    Current Outpatient Medications on File Prior to Visit  Medication Sig Dispense Refill  . estrogens, conjugated, (PREMARIN) 0.625 MG tablet Take 0.625 mg by mouth daily. Take daily for 21 days then do not take for 7 days.    Marland Kitchen UNABLE TO FIND Med Name: CBD OIL     No current facility-administered medications on file prior to visit.     BP (!) 149/65 (BP Location: Right Arm, Patient Position:  Sitting, Cuff Size: Large)   Pulse 62   Temp (!) 97.5 F (36.4 C) (Oral)   Resp 16   Ht 5\' 1"  (1.549 m)   Wt 143 lb 3.2 oz (65 kg)   SpO2 100%   BMI 27.06 kg/m       Objective:   Physical Exam  Constitutional: She appears well-developed and well-nourished.  HENT:  Head: Normocephalic and atraumatic.  Neck: Neck supple.  slightly prominent thyroid.   Cardiovascular: Normal rate, regular rhythm and normal heart sounds.  No murmur heard. Pulmonary/Chest: Effort normal and breath sounds normal. No respiratory distress. She has no wheezes.  Psychiatric: She has a normal mood and affect. Her behavior is normal. Judgment and thought content normal.          Assessment & Plan:  Hyperthyroid- repeat TFT's, obtain thyroid US. Refer to Endo for further evaluation. Advised pt to schedule a cpx at her convenience.

## 2017-07-10 NOTE — Telephone Encounter (Signed)
Left message to return call 

## 2017-07-15 NOTE — Telephone Encounter (Signed)
Erica Diaz 07/15/2017 07:50 AM  Summary: call back    Patient called and said that she missed a call from her provider about her [place] and would like a call back on her cell phone instead of her home phone.

## 2017-07-16 NOTE — Telephone Encounter (Signed)
Spoke with pt. Re: labs and referral. Provided pt with # for [facility] and she will call to schedule the appt.   Erica Diaz (Patient) Erica Diaz (Patient) Referral - Question  Reason for CRM: pt's husband called and said she has not heard from anyone in regards to her seeing a specialist for her thyroid.     Call back @ [phone]

## 2017-07-18 ENCOUNTER — Ambulatory Visit: Payer: 59 | Admitting: Internal Medicine

## 2017-07-18 ENCOUNTER — Encounter: Payer: Self-pay | Admitting: Internal Medicine

## 2017-07-18 VITALS — BP 110/70 | HR 65 | Temp 98.0°F | Resp 16 | Ht 61.0 in | Wt 141.4 lb

## 2017-07-18 DIAGNOSIS — E0590 Thyrotoxicosis, unspecified without thyrotoxic crisis or storm: Secondary | ICD-10-CM | POA: Diagnosis not present

## 2017-07-18 LAB — TSH

## 2017-07-18 LAB — T4, FREE: FREE T4: 1.09 ng/dL (ref 0.60–1.60)

## 2017-07-18 LAB — T3, FREE: T3 FREE: 4.8 pg/mL — AB (ref 2.3–4.2)

## 2017-07-18 NOTE — Patient Instructions (Signed)
Please stop at the lab.  Please come back for a follow-up appointment in 3-4 months.   Hyperthyroidism Hyperthyroidism is when the thyroid is too active (overactive). Your thyroid is a large gland that is located in your neck. The thyroid helps to control how your body uses food (metabolism). When your thyroid is overactive, it produces too much of a hormone called thyroxine. What are the causes? Causes of hyperthyroidism may include:  Graves disease. This is when your immune system attacks the thyroid gland. This is the most common cause.  Inflammation of the thyroid gland.  Tumor in the thyroid gland or somewhere else.  Excessive use of thyroid medicines, including: ? Prescription thyroid supplement. ? Herbal supplements that mimic thyroid hormones.  Solid or fluid-filled lumps within your thyroid gland (thyroid nodules).  Excessive ingestion of iodine.  What increases the risk?  Being female.  Having a family history of thyroid conditions. What are the signs or symptoms? Signs and symptoms of hyperthyroidism may include:  Nervousness.  Inability to tolerate heat.  Unexplained weight loss.  Diarrhea.  Change in the texture of hair or skin.  Heart skipping beats or making extra beats.  Rapid heart rate.  Loss of menstruation.  Shaky hands.  Fatigue.  Restlessness.  Increased appetite.  Sleep problems.  Enlarged thyroid gland or nodules.  How is this diagnosed? Diagnosis of hyperthyroidism may include:  Medical history and physical exam.  Blood tests.  Ultrasound tests.  How is this treated? Treatment may include:  Medicines to control your thyroid.  Surgery to remove your thyroid.  Radiation therapy.  Follow these instructions at home:  Take medicines only as directed by your health care provider.  Do not use any tobacco products, including cigarettes, chewing tobacco, or electronic cigarettes. If you need help quitting, ask your  health care provider.  Do not exercise or do physical activity until your health care provider approves.  Keep all follow-up appointments as directed by your health care provider. This is important. Contact a health care provider if:  Your symptoms do not get better with treatment.  You have fever.  You are taking thyroid replacement medicine and you: ? Have depression. ? Feel mentally and physically slow. ? Have weight gain. Get help right away if:  You have decreased alertness or a change in your awareness.  You have abdominal pain.  You feel dizzy.  You have a rapid heartbeat.  You have an irregular heartbeat. This information is not intended to replace advice given to you by your health care provider. Make sure you discuss any questions you have with your health care provider. Document Released: 05/07/2005 Document Revised: 10/06/2015 Document Reviewed: 09/22/2013 Elsevier Interactive Patient Education  2018 [employer].

## 2017-07-18 NOTE — Progress Notes (Signed)
Patient ID: [name], female   DOB: 08/11/1960, 57 y.o.   MRN: [id number]    HPI  Erica Diaz is a 57 y.o.-year-old female, referred by her Erica Alar, NP for evaluation for thyrotoxicosis.  Patient was found to have a low TSH during a visit with OB/GYN in 05/2017.  A repeat set of tests by PCP earlier this month showed a TSH still abnormal, with a normal free T4 and a high free T3.  She was sent for a thyroid ultrasound, which she had on 07/09/2017 and was normal.  She has had normal TFTs before, in last 6 years.   She remembers having a URI in 05/2017 >> went to UC >> given steroids.  I reviewed pt's thyroid tests: Lab Results  Component Value Date   TSH <0.01 Repeated and verified X2. (L) 07/09/2017   FREET4 1.16 07/09/2017   T3FREE 4.5 (H) 07/09/2017  06/26/2017: Free T3 6.0 (2-4.4) 06/19/2017: TSH <0.006; normal WBC  Pt denies feeling nodules in neck, hoarseness, dysphagia/odynophagia, SOB with lying down; she denies: - fatigue - excessive sweating/heat intolerance - tremors - anxiety - palpitations - hyperdefecation - weight loss - hair loss  Pt does have a FH of thyroid ds.  Hyperthyroidism in daughter Erica Diaz ds.). No FH of thyroid cancer. No h/o radiation tx to head or neck.  No seaweed or kelp, no recent contrast studies. + steroid use 05/2017. No herbal supplements. No Biotin use.  She uses CBD oil - started 2.5 mo ago. Tries to stop Premarin. Menopause was at 57 y/o.  ROS: Constitutional: no weight gain/loss, no fatigue, no subjective hyperthermia/hypothermia Eyes: no blurry vision, no xerophthalmia ENT: no sore throat, no nodules palpated in throat, no dysphagia/odynophagia, no hoarseness Cardiovascular: no CP/SOB/palpitations/leg swelling Respiratory: no cough/SOB Gastrointestinal: no N/V/D/C Musculoskeletal: no muscle/joint aches Skin: no rashes Neurological: no tremors/numbness/tingling/dizziness Psychiatric: no depression/anxiety  Past  Medical History:  Diagnosis Date  . 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 Endometriosis   . Hyperlipidemia   . Migraines    Past Surgical History:  Procedure Laterality Date  . ABDOMINAL HYSTERECTOMY  05/21/90   partial hysterectomy. Has right ovary  . CESAREAN SECTION  1994  . ear surgery  2000   repair of R TM  . WISDOM TOOTH EXTRACTION  1998   Social History   Socioeconomic History  . Marital status: Married    Spouse name: Not on file  . Number of children: 1  Social Needs  Occupational History    Employer: [employer]  Tobacco Use  . Smoking status: Former Smoker    Years: 8.00    Last attempt to quit: 05/21/1990    Years since quitting: 27.1  . Smokeless tobacco: Never Used  Substance and Sexual Activity  . Alcohol use: No    Alcohol/week: 0.0 oz  . Drug use: No  Social History Narrative   Caffeine:  1 cup coffee daily   Regular exercise:  Active work life. (walks a lot on job)   1 biological child, 2 step children   Works in Water engineer at [employer]   Married,    One daughter, 2 step daughters.    Current Outpatient Medications on File Prior to Visit  Medication Sig Dispense Refill  . estrogens, conjugated, (PREMARIN) 0.625 MG tablet Take 0.625 mg by mouth daily. Take daily for 21 days then do not take for 7 days.    Marland Kitchen UNABLE TO FIND Med Name: CBD OIL  No current facility-administered medications on file prior to visit.    Allergies  Allergen Reactions  . Thorazine [Chlorpromazine Hcl] Other (See Comments)    Neurologic disturbance  . Codeine Itching  . Sulfa Drugs Cross Reactors Hives   Family History  Problem Relation Age of Onset  . Diabetes Mother   . Heart disease Mother   . Heart disease Father 64  . Diabetes Maternal Grandfather   . Colon cancer Neg Hx     PE: BP 110/70   Pulse 65   Temp 98 F (36.7 C) (Oral)   Resp 16   Ht 5\' 1"  (1.549 m)   Wt 141 lb 6 oz (64.1 kg)   SpO2 98%   BMI 26.71 kg/m  Wt Readings from  Last 3 Encounters:  07/18/17 141 lb 6 oz (64.1 kg)  07/09/17 143 lb 3.2 oz (65 kg)  10/08/14 142 lb (64.4 kg)   Constitutional: overweight, in NAD Eyes: PERRLA, EOMI, no exophthalmos, no lid lag, no stare ENT: moist mucous membranes, no thyromegaly, no thyroid bruits, no cervical lymphadenopathy Cardiovascular: RRR, No MRG Respiratory: CTA B Gastrointestinal: abdomen soft, NT, ND, BS+ Musculoskeletal: no deformities, strength intact in all 4 Skin: moist, warm, no rashes Neurological: no tremor with outstretched hands, DTR normal in all 4  ASSESSMENT: 1. Thyrotoxicosis  PLAN:  1. Patient with a recently found low TSH x2 assoc. With a normal free T4 but elevated free T3. These developed after a URI in 05/2017. Her free T3 improved at last check by PCP, 9 days ago. She has no thyrotoxic sxs: denies weight loss, heat intolerance, hyperdefecation, palpitations, anxiety. Also, on physical exam today, she has no signs of hyperthyroidism. - she does not appear to have exogenous causes for the low TSH, other than Prednisone taken before 1st set of abnormal TFTs. - We discussed that possible causes of thyrotoxicosis are:  Thyroiditis (my suspicion is high for this) Graves ds  (daughter dx'ed with this last year) toxic multinodular goiter/ toxic adenoma (I cannot feel nodules at palpation of her thyroid and her thyroid U/S was normal). - will check the TSH, fT3 and fT4 and also add thyroid stimulating antibodies to screen for Graves' disease.  - If the tests remain abnormal, we may need an uptake and scan to differentiate between the 3 above possible etiologies  - we discussed about possible modalities of treatment for the above conditions, to include methimazole use, radioactive iodine ablation or (last resort) surgery. - I do not feel that we need to add beta blockers at this time, since she is not tachycardic or tremulous - RTC in 3-4 months, but likely sooner for repeat labs  Component      Latest Ref Rng & Units 07/18/2017  TSH     0.35 - 4.50 uIU/mL <0.01 (L)  Triiodothyronine,Free,Serum     2.3 - 4.2 pg/mL 4.8 (H)  T4,Free(Direct)     0.60 - 1.60 ng/dL 1.09  TSI     <140 % baseline 297 (H)   Her tests are still abnormal, and the higher TSI's would point more towards Graves' disease.  TSI's can be slightly elevated in thyroiditis, also. Because the history is more consistent with thyroiditis, but she has a family history of Graves' disease and her TSI's are elevated, I would like to obtain a thyroid uptake and scan to clarify.  Because she is not symptomatic in her free thyroid hormones are normal or only slightly high, I would not suggest treatment for  now.  I would like to repeat her tests in 1.5 months,   But we may decide to start treatment depending on the results of her uptake and scan.  I will addend the results when they become available.  Philemon Kingdom, MD PhD [facility]

## 2017-07-22 LAB — THYROID STIMULATING IMMUNOGLOBULIN: TSI: 297 %{baseline} — AB (ref <140)

## 2017-07-23 ENCOUNTER — Other Ambulatory Visit: Payer: Self-pay | Admitting: Internal Medicine

## 2017-07-23 DIAGNOSIS — E0590 Thyrotoxicosis, unspecified without thyrotoxic crisis or storm: Secondary | ICD-10-CM

## 2017-08-08 MED FILL — PREMARIN 0.45 MG TABLET: 0.45 | 30 days supply | Qty: 30 | Fill #1

## 2017-09-10 MED FILL — PREMARIN 0.45 MG TABLET: 0.45 | 30 days supply | Qty: 30 | Fill #2

## 2017-09-19 ENCOUNTER — Ambulatory Visit: Payer: 59 | Admitting: Internal Medicine

## 2017-09-19 ENCOUNTER — Encounter: Payer: Self-pay | Admitting: Internal Medicine

## 2017-09-19 VITALS — BP 122/72 | HR 62 | Ht 61.0 in | Wt 144.8 lb

## 2017-09-19 DIAGNOSIS — E041 Nontoxic single thyroid nodule: Secondary | ICD-10-CM

## 2017-09-19 DIAGNOSIS — E0590 Thyrotoxicosis, unspecified without thyrotoxic crisis or storm: Secondary | ICD-10-CM | POA: Insufficient documentation

## 2017-09-19 HISTORY — DX: Thyrotoxicosis, unspecified without thyrotoxic crisis or storm: E05.90

## 2017-09-19 NOTE — Progress Notes (Addendum)
Patient ID: Erica Diaz, female   DOB: 1961-03-19, 57 y.o.   MRN: [id number]    HPI  Erica Diaz is a 57 y.o.-year-old female, returning for f/u for thyrotoxicosis. Last visit 2 mo ago.  She is here with her husband.  Reviewed and addended hx:  She had a URI in 05/2017 >> went to UC. Given steroids. She was found to have a low TSH during a visit with ObGyn in 05/2017.  A repeat set of tests by PCP showed a TSH still abnormal, with a normal free T4 and a high free T3.   A thyroid U/S (07/09/2017) was normal. Previously normal TFTs.  I reviewed her TFTs: Lab Results  Component Value Date   TSH <0.01 (L) 07/18/2017   TSH <0.01 Repeated and verified X2. (L) 07/09/2017   FREET4 1.09 07/18/2017   FREET4 1.16 07/09/2017   T3FREE 4.8 (H) 07/18/2017   T3FREE 4.5 (H) 07/09/2017  06/26/2017: Free T3 6.0 (2-4.4) 06/19/2017: TSH <0.006; normal WBC  At last visit, TSI's were mildly elevated: Lab Results  Component Value Date   TSI 297 (H) 07/18/2017   I also ordered a thyroid uptake and scan, but she was not called to schedule this, so she did not have it yet.  Pt denies: - feeling nodules in neck - hoarseness - dysphagia - choking - SOB with lying down  Pt does have a FH of thyroid ds.  Hyperthyroidism in daughter [name] ds.). No FH of thyroid cancer. No h/o radiation tx to head or neck.  No seaweed or kelp. No recent contrast studies. No herbal supplements. No Biotin use. No recent steroids use.   She uses CBD oil. Tries to stop Premarin. Menopause was at 57 y/o.  ROS: Constitutional: no weight gain/no weight loss, no fatigue, no subjective hyperthermia, no subjective hypothermia Eyes: no blurry vision, no xerophthalmia ENT: no sore throat, + see HPI Cardiovascular: no CP/no SOB/no palpitations/no leg swelling Respiratory: no cough/no SOB/no wheezing Gastrointestinal: no N/no V/no D/no C/no acid reflux Musculoskeletal: no muscle aches/no joint aches Skin: no rashes, no  hair loss Neurological: no tremors/no numbness/no tingling/no dizziness  I reviewed pt's medications, allergies, PMH, social hx, family hx, and changes were documented in the history of present illness. Otherwise, unchanged from my initial visit note.  Past Medical History:  Diagnosis Date  . 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 Endometriosis   . Hyperlipidemia   . Migraines    Past Surgical History:  Procedure Laterality Date  . ABDOMINAL HYSTERECTOMY  05/21/90   partial hysterectomy. Has right ovary  . CESAREAN SECTION  1994  . ear surgery  2000   repair of R TM  . WISDOM TOOTH EXTRACTION  1998   Social History   Socioeconomic History  . Marital status: Married    Spouse name: Not on file  . Number of children: 1  Social Needs  Occupational History    Employer: [employer]  Tobacco Use  . Smoking status: Former Smoker    Years: 8.00    Last attempt to quit: 05/21/1990    Years since quitting: 27.1  . Smokeless tobacco: Never Used  Substance and Sexual Activity  . Alcohol use: No    Alcohol/week: 0.0 oz  . Drug use: No  Social History Narrative   Caffeine:  1 cup coffee daily   Regular exercise:  Active work life. (walks a lot on job)   1 biological child, 2 step children  Works in Water engineer at [employer]   Married,    One daughter, 2 step daughters.    Current Outpatient Medications on File Prior to Visit  Medication Sig Dispense Refill  . estrogens, conjugated, (PREMARIN) 0.625 MG tablet Take 0.625 mg by mouth daily. Take daily for 21 days then do not take for 7 days.    Marland Kitchen UNABLE TO FIND Med Name: CBD OIL     No current facility-administered medications on file prior to visit.    Allergies  Allergen Reactions  . Thorazine [Chlorpromazine Hcl] Other (See Comments)    Neurologic disturbance  . Codeine Itching  . Sulfa Drugs Cross Reactors Hives   Family History  Problem Relation Age of Onset  . Diabetes Mother   . Heart disease Mother   .  Heart disease Father 29  . Diabetes Maternal Grandfather   . Colon cancer Neg Hx     PE: BP 122/72   Pulse 62   Ht 5\' 1"  (1.549 m)   Wt 144 lb 12.8 oz (65.7 kg)   SpO2 98%   BMI 27.36 kg/m  Wt Readings from Last 3 Encounters:  09/19/17 144 lb 12.8 oz (65.7 kg)  07/18/17 141 lb 6 oz (64.1 kg)  07/09/17 143 lb 3.2 oz (65 kg)   Constitutional: normal weight, in NAD Eyes: PERRLA, EOMI, no exophthalmos ENT: moist mucous membranes, no thyromegaly, no cervical lymphadenopathy Cardiovascular: RRR, No MRG Respiratory: CTA B Gastrointestinal: abdomen soft, NT, ND, BS+ Musculoskeletal: no deformities, strength intact in all 4 Skin: moist, warm, no rashes Neurological: no tremor with outstretched hands, DTR normal in all 4   ASSESSMENT: 1. Thyrotoxicosis  PLAN:  1. Patient with recently suppressed TSH levels in the setting of a normal free T4 and elevated free T3. These were detected after a URI. TSI antibodies checked at last visit were slightly elevated, but this is a finding that can appear in thyroiditis, not only in Graves ds, so at last visit, I suggested to get a thyroid Uptake and scan, however, she did not have this yet as she was not called to schedule this appointment. - she continues to be asymptomatic, denies weight loss, palpitations, heat intolerance, or other thyrotoxic sxs - we again discussed that she likely had an episode of subacute thyroiditis after her URI or she may have Graves ds - we reviewed her medication and supplement list at this visit, and she is actually taking Ashwaganda and B complex.  We discussed about how each of these can influence the thyroid tests.  I advised her to stop both and come back for labs in 1 week to avoid any interference from the thyroid stimulant or biotin and re-order the uptake and scan if still abnormal then - discussed different tx's for the above conditions to include MMI use, radioactive iodine ablation or (last resort) surgery. -  will not add beta blockers since not tachycardiac/tremulous - RTC in 6 mo but sooner for labs  - time spent with the patient: 25 min, of which >50% was spent in obtaining information about her symptoms, reviewing her previous labs, evaluations, and treatments, counseling her about her condition (please see the discussed topics above), and developing a plan to further investigate it; she had a number of questions which I addressed.  Component     Latest Ref Rng & Units 09/27/2017  TSH     0.35 - 4.50 uIU/mL 0.02 (L)  Triiodothyronine,Free,Serum     2.3 - 4.2 pg/mL 3.5  T4,Free(Direct)     0.60 - 1.60 ng/dL 0.88   The TSH is now detectable, and the free thyroid hormones are normal.  Since she is asymptomatic I would suggest to repeat the tests in 1.5-2 months without intervention but I would still want her to have an uptake and scan done.  I will order this again.   Details   Reading Physician Reading Date Result Priority  Lavonia Dana, MD 10/22/2017     Narrative    CLINICAL DATA: Hyperthyroidism  EXAM: THYROID SCAN AND UPTAKE - 4 AND 24 HOURS  TECHNIQUE: Following oral administration of I-123 capsule, anterior planar imaging was acquired at 24 hours. Thyroid uptake was calculated with a thyroid probe at 4-6 hours and 24 hours.  RADIOPHARMACEUTICALS: 301 uCi I-123 sodium iodide p.o.  COMPARISON: None  FINDINGS: Question cold nodule at inferior pole of the RIGHT thyroid lobe.  Remaining thyroid tissue demonstrates homogeneous tracer uptake.  4 hour I-123 uptake = 6.8% (normal 5-20%)  24 hour I-123 uptake = 15.4% (normal 10-30%)  IMPRESSION: Normal 4 hour and 24 hour radio iodine uptakes as above.  Question cold nodule at inferior pole RIGHT thyroid lobe; thyroid ultrasound recommended to assess.  Electronically Signed By: Lavonia Dana M.D. 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 No intervention  needed, but need to recheck labs as mentioned above.  Possible cold thyroid nodule >> however, a thyroid U/S checked 4 mo ago did not show a lesion. Will continue to follow this.   Philemon Kingdom, MD PhD [facility] Endocrinology

## 2017-09-19 NOTE — Patient Instructions (Signed)
Please stop Ashwaganda.  Please hold B complex for 1 week before thyroid labs.  Come back in 1 week for labs.  Please come back for a follow-up appointment in 6 months.

## 2017-09-27 ENCOUNTER — Other Ambulatory Visit (INDEPENDENT_AMBULATORY_CARE_PROVIDER_SITE_OTHER): Payer: 59

## 2017-09-27 DIAGNOSIS — E0590 Thyrotoxicosis, unspecified without thyrotoxic crisis or storm: Secondary | ICD-10-CM

## 2017-09-27 LAB — T3, FREE: T3, Free: 3.5 pg/mL (ref 2.3–4.2)

## 2017-09-27 LAB — T4, FREE: Free T4: 0.88 ng/dL (ref 0.60–1.60)

## 2017-09-27 LAB — TSH: TSH: 0.02 u[IU]/mL — AB (ref 0.35–4.50)

## 2017-09-27 NOTE — Addendum Note (Signed)
Addended by: Philemon Kingdom on: 09/27/2017 05:28 PM   Modules accepted: Orders

## 2017-10-03 ENCOUNTER — Telehealth: Payer: Self-pay

## 2017-10-03 ENCOUNTER — Other Ambulatory Visit: Payer: Self-pay | Admitting: Internal Medicine

## 2017-10-03 NOTE — Telephone Encounter (Signed)
LVM requesting patient call back to discuss her lab results

## 2017-10-04 MED FILL — PREMARIN 0.45 MG TABLET: 0.45 | 30 days supply | Qty: 30 | Fill #3

## 2017-10-21 ENCOUNTER — Encounter (HOSPITAL_COMMUNITY)
Admission: RE | Admit: 2017-10-21 | Discharge: 2017-10-21 | Disposition: A | Discharge status: Home / Self Care | Payer: 59 | Source: Ambulatory Visit | Attending: Internal Medicine | Admitting: Internal Medicine

## 2017-10-21 DIAGNOSIS — E0590 Thyrotoxicosis, unspecified without thyrotoxic crisis or storm: Secondary | ICD-10-CM | POA: Insufficient documentation

## 2017-10-21 MED ORDER — SODIUM IODIDE I-123 7.4 MBQ CAPS
200.0000 | ORAL_CAPSULE | Freq: Once | ORAL | Status: AC
  Administered 2017-10-21: 301 via ORAL

## 2017-10-22 ENCOUNTER — Encounter (HOSPITAL_COMMUNITY)
Admission: RE | Admit: 2017-10-22 | Discharge: 2017-10-22 | Disposition: A | Discharge status: Home / Self Care | Payer: 59 | Source: Ambulatory Visit | Attending: Internal Medicine | Admitting: Internal Medicine

## 2017-10-22 DIAGNOSIS — E0590 Thyrotoxicosis, unspecified without thyrotoxic crisis or storm: Secondary | ICD-10-CM | POA: Diagnosis not present

## 2017-10-23 NOTE — Addendum Note (Signed)
Addended by: Philemon Kingdom on: 10/23/2017 12:44 PM   Modules accepted: Orders

## 2017-10-28 ENCOUNTER — Ambulatory Visit: Payer: 59 | Admitting: Internal Medicine

## 2017-11-14 MED FILL — PREMARIN 0.45 MG TABLET: 0.45 | 30 days supply | Qty: 30 | Fill #4

## 2017-12-12 MED FILL — PREMARIN 0.45 MG TABLET: 0.45 | 30 days supply | Qty: 30 | Fill #5

## 2018-01-13 MED FILL — PREMARIN 0.45 MG TABLET: 0.45 | 30 days supply | Qty: 30 | Fill #6

## 2018-02-11 MED FILL — PREMARIN 0.45 MG TABLET: 0.45 | 30 days supply | Qty: 30 | Fill #7

## 2018-03-06 MED FILL — PREMARIN 0.45 MG TABLET: 0.45 | 30 days supply | Qty: 30 | Fill #8

## 2018-03-25 ENCOUNTER — Ambulatory Visit: Payer: 59 | Admitting: Internal Medicine

## 2018-04-02 MED FILL — PREMARIN 0.45 MG TABLET: 0.45 | 30 days supply | Qty: 30 | Fill #9

## 2018-04-09 ENCOUNTER — Ambulatory Visit: Payer: 59 | Admitting: Internal Medicine

## 2018-04-09 ENCOUNTER — Encounter: Payer: Self-pay | Admitting: Internal Medicine

## 2018-04-09 VITALS — BP 102/84 | HR 66 | Temp 98.1°F | Resp 18 | Ht 61.0 in | Wt 147.0 lb

## 2018-04-09 DIAGNOSIS — J019 Acute sinusitis, unspecified: Secondary | ICD-10-CM | POA: Diagnosis not present

## 2018-04-09 MED ORDER — AZITHROMYCIN 250 MG PO TABS: ORAL_TABLET | ORAL | 0 refills | Status: DC

## 2018-04-09 MED ORDER — FLUTICASONE PROPIONATE 50 MCG/ACT NA SUSP: 2.0000 | Freq: Every day | NASAL | 6 refills | Status: DC

## 2018-04-09 MED FILL — FLUTICASONE PROP 50 MCG SPR: 50 | 30 days supply | Qty: 16 | Fill #0

## 2018-04-09 MED FILL — AZITHROMYCIN 250 MG TABLET: 250 | 5 days supply | Qty: 6 | Fill #0

## 2018-04-09 NOTE — Patient Instructions (Signed)
Rest, fluids , tylenol  For cough:  Take Mucinex DM twice a day as needed until better  For nasal congestion: Use OTC Nasocort or Flonase : 2 nasal sprays on each side of the nose in the morning until you feel better   Take the antibiotic as prescribed  (zithromax)  Call if not gradually better over the next  10 days  Call anytime if the symptoms are severe

## 2018-04-09 NOTE — Progress Notes (Signed)
Subjective:    Patient ID: Erica Diaz, female    DOB: [birth year], 57 y.o.   MRN: [id number]  DOS:  04/09/2018 Type of visit - description : acute Symptoms of started a week ago with a sore throat, then ear pressure and sinus pain, worse on the right. Taking TheraFlu with little help.  Review of Systems Had subjective fever 2 nights ago but no more. Has a dry cough, no chest congestion. Mild headache. No nausea or vomiting No myalgias  Past Medical History:  Diagnosis Date  . 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 Endometriosis   . Hyperlipidemia   . Migraines     Past Surgical History:  Procedure Laterality Date  . ABDOMINAL HYSTERECTOMY  05/21/90   partial hysterectomy. Has right ovary  . CESAREAN SECTION  1994  . ear surgery  2000   repair of R TM  . WISDOM TOOTH EXTRACTION  1998    Social History   Socioeconomic History  . Marital status: Married    Spouse name: Not on file  . Number of children: 1  . Years of education: Not on file  . Highest education level: Not on file  Occupational History    Employer: [employer]  . Financial resource strain: Not on file  . Food insecurity:    Worry: Not on file    Inability: Not on file  . Transportation needs:    Medical: Not on file    Non-medical: Not on file  Tobacco Use  . Smoking status: Former Smoker    Years: 8.00    Last attempt to quit: 05/21/1990    Years since quitting: 27.9  . Smokeless tobacco: Never Used  Substance and Sexual Activity  . Alcohol use: No    Alcohol/week: 0.0 standard drinks  . Drug use: No  . Sexual activity: Not on file  Lifestyle  . Physical activity:    Days per week: Not on file    Minutes per session: Not on file  . Stress: Not on file  Relationships  . Social connections:    Talks on phone: Not on file    Gets together: Not on file    Attends religious service: Not on file    Active member of club or organization: Not on file    Attends  meetings of clubs or organizations: Not on file    Relationship status: Not on file  . Intimate partner violence:    Fear of current or ex partner: Not on file    Emotionally abused: Not on file    Physically abused: Not on file    Forced sexual activity: Not on file  Other Topics Concern  . Not on file  Social History Narrative   Caffeine:  1 cup coffee daily   Regular exercise:  Active work life. (walks a lot on job)   1 biological child, 2 step children   Works in Water engineer at [employer]   Married,    One daughter, 2 step daughters.                   Allergies as of 04/09/2018      Reactions   Thorazine [chlorpromazine Hcl] Other (See Comments)   Neurologic disturbance   Codeine Itching   Sulfa Drugs Cross Reactors Hives      Medication List        Accurate as of 04/09/18 11:28 AM. Always use your  most recent med list.          B COMPLEX PO Take 1 capsule by mouth daily.   COLLAGEN PO Take 1 capsule by mouth daily.   estrogens (conjugated) 0.625 MG tablet Commonly known as:  PREMARIN Take 0.625 mg by mouth daily. Take daily for 21 days then do not take for 7 days.   UNABLE TO FIND Med Name: CBD OIL   VITAMIN D3 PO Take 1 capsule by mouth daily.   VITAMIN K2 PO Take 1 capsule by mouth daily.           Objective:   Physical Exam BP 102/84 (BP Location: Left Arm, Patient Position: Sitting, Cuff Size: Normal)   Pulse 66   Temp 98.1 F (36.7 C) (Oral)   Resp 18   Ht 5\' 1"  (1.549 m)   Wt 147 lb (66.7 kg)   SpO2 98%   BMI 27.78 kg/m   General:   Well developed, NAD, BMI noted. HEENT:  Normocephalic . Face symmetric, atraumatic.  TMs normal, nose is slightly congested, right maxillary sinuses slightly tender, left normal. Throat symmetric, no red. Hoarseness noted Lungs:  CTA B Normal respiratory effort, no intercostal retractions, no accessory muscle use. Heart: RRR,  no murmur.  No pretibial edema bilaterally  Skin: Not pale.  Not jaundice Neurologic:  alert & oriented X3.  Speech normal, gait appropriate for age and unassisted Psych--  Cognition and judgment appear intact.  Cooperative with normal attention span and concentration.  Behavior appropriate. No anxious or depressed appearing.      Assessment & Plan:    57 year old female, PMH includes thyroid disease, menopause, on HRT, presents with  Sinusitis: Symptoms consistent with sinusitis, recommend Flonase, Mucinex DM, Zithromax and rest.  Work note provided for 3 days, will call if need more days off. See AVS.

## 2018-04-09 NOTE — Progress Notes (Signed)
Pre visit review using our clinic review tool, if applicable. No additional management support is needed unless otherwise documented below in the visit note. 

## 2018-05-15 MED FILL — PREMARIN 0.45 MG TABLET: 0.45 | 30 days supply | Qty: 30 | Fill #10

## 2018-06-09 MED FILL — PREMARIN 0.45 MG TABLET: 0.45 | 30 days supply | Qty: 30 | Fill #11

## 2018-06-24 DIAGNOSIS — Z79890 Hormone replacement therapy (postmenopausal): Secondary | ICD-10-CM | POA: Diagnosis not present

## 2018-06-24 DIAGNOSIS — Z01419 Encounter for gynecological examination (general) (routine) without abnormal findings: Secondary | ICD-10-CM | POA: Diagnosis not present

## 2018-06-24 DIAGNOSIS — Z1231 Encounter for screening mammogram for malignant neoplasm of breast: Secondary | ICD-10-CM | POA: Diagnosis not present

## 2018-06-24 DIAGNOSIS — Z1272 Encounter for screening for malignant neoplasm of vagina: Secondary | ICD-10-CM | POA: Diagnosis not present

## 2018-06-24 LAB — HM MAMMOGRAPHY

## 2018-06-27 ENCOUNTER — Encounter: Payer: Self-pay | Admitting: Family

## 2018-06-27 ENCOUNTER — Ambulatory Visit: Payer: 59 | Admitting: Family

## 2018-06-27 VITALS — BP 114/60 | HR 68 | Temp 98.5°F | Resp 16 | Ht 61.0 in | Wt 144.0 lb

## 2018-06-27 DIAGNOSIS — E0590 Thyrotoxicosis, unspecified without thyrotoxic crisis or storm: Secondary | ICD-10-CM | POA: Diagnosis not present

## 2018-06-27 DIAGNOSIS — Z0000 Encounter for general adult medical examination without abnormal findings: Secondary | ICD-10-CM

## 2018-06-27 DIAGNOSIS — Z23 Encounter for immunization: Secondary | ICD-10-CM

## 2018-06-27 DIAGNOSIS — E2839 Other primary ovarian failure: Secondary | ICD-10-CM

## 2018-06-27 LAB — BASIC METABOLIC PANEL
BUN: 14 mg/dL (ref 6–23)
CALCIUM: 9.4 mg/dL (ref 8.4–10.5)
CO2: 25 meq/L (ref 19–32)
CREATININE: 0.67 mg/dL (ref 0.40–1.20)
Chloride: 106 mEq/L (ref 96–112)
GFR: 90.39 mL/min (ref >60.00)
Glucose, Bld: 83 mg/dL (ref 70–99)
Potassium: 4.5 mEq/L (ref 3.5–5.1)
Sodium: 140 mEq/L (ref 135–145)

## 2018-06-27 LAB — CBC WITH DIFFERENTIAL/PLATELET
Basophils Absolute: 0 10*3/uL (ref 0.0–0.1)
Basophils Relative: 0.7 % (ref 0.0–3.0)
Eosinophils Absolute: 0.1 10*3/uL (ref 0.0–0.7)
Eosinophils Relative: 2 % (ref 0.0–5.0)
HCT: 42.8 % (ref 36.0–46.0)
Hemoglobin: 14.4 g/dL (ref 12.0–15.0)
Lymphocytes Relative: 33.4 % (ref 12.0–46.0)
Lymphs Abs: 1.6 10*3/uL (ref 0.7–4.0)
MCHC: 33.7 g/dL (ref 30.0–36.0)
MCV: 87.9 fl (ref 78.0–100.0)
Monocytes Absolute: 0.3 10*3/uL (ref 0.1–1.0)
Monocytes Relative: 5.6 % (ref 3.0–12.0)
NEUTROS ABS: 2.7 10*3/uL (ref 1.4–7.7)
Neutrophils Relative %: 58.3 % (ref 43.0–77.0)
PLATELETS: 309 10*3/uL (ref 150.0–400.0)
RBC: 4.87 Mil/uL (ref 3.87–5.11)
RDW: 12.7 % (ref 11.5–15.5)
WBC: 4.7 10*3/uL (ref 4.0–10.5)

## 2018-06-27 LAB — LIPID PANEL
Cholesterol: 221 mg/dL — ABNORMAL HIGH (ref 0–200)
HDL: 70.9 mg/dL (ref >39.00)
LDL Cholesterol: 130 mg/dL — ABNORMAL HIGH (ref 0–99)
NonHDL: 149.89
Total CHOL/HDL Ratio: 3
Triglycerides: 99 mg/dL (ref 0.0–149.0)
VLDL: 19.8 mg/dL (ref 0.0–40.0)

## 2018-06-27 LAB — HEPATIC FUNCTION PANEL
ALT: 16 U/L (ref 0–35)
AST: 14 U/L (ref 0–37)
Albumin: 4.4 g/dL (ref 3.5–5.2)
Alkaline Phosphatase: 80 U/L (ref 39–117)
Bilirubin, Direct: 0.1 mg/dL (ref 0.0–0.3)
Total Bilirubin: 0.6 mg/dL (ref 0.2–1.2)
Total Protein: 7.2 g/dL (ref 6.0–8.3)

## 2018-06-27 LAB — URINALYSIS, ROUTINE W REFLEX MICROSCOPIC
Bilirubin Urine: NEGATIVE
Hgb urine dipstick: NEGATIVE
Ketones, ur: NEGATIVE
Leukocytes, UA: NEGATIVE
Nitrite: NEGATIVE
PH: 5.5 (ref 5.0–8.0)
RBC / HPF: NONE SEEN (ref 0–?)
Specific Gravity, Urine: 1.025 (ref 1.000–1.030)
TOTAL PROTEIN, URINE-UPE24: NEGATIVE
UROBILINOGEN UA: 0.2 (ref 0.0–1.0)
Urine Glucose: NEGATIVE

## 2018-06-27 LAB — T3, FREE: T3 FREE: 3.9 pg/mL (ref 2.3–4.2)

## 2018-06-27 LAB — T4, FREE: FREE T4: 0.98 ng/dL (ref 0.60–1.60)

## 2018-06-27 LAB — TSH: TSH: 0.04 u[IU]/mL — ABNORMAL LOW (ref 0.35–4.50)

## 2018-06-27 NOTE — Progress Notes (Signed)
Subjective:    Patient ID: Erica Diaz, female    DOB: 08/08/1960, 58 y.o.   MRN: [id number]  HPI  Patient presents today for complete physical.  Immunizations:  Due for shingrix #1 Diet: healthy Exercise: active at work.   Colonoscopy:  Due 2026 Dexa:  Never, due Pap Smear: hysterectomy Mammogram: 06/06/17 Dental: 1/20 Vision: due     Review of Systems  Constitutional: Negative for unexpected weight change.  HENT: Negative for hearing loss and rhinorrhea.   Eyes: Negative for visual disturbance.  Respiratory: Negative for cough and shortness of breath.   Cardiovascular: Negative for chest pain and leg swelling.  Gastrointestinal: Negative for blood in stool, constipation and diarrhea.  Genitourinary: Negative for dysuria, frequency and hematuria.  Musculoskeletal: Negative for arthralgias and myalgias.  Skin: Negative for rash.  Neurological: Negative for headaches.  Hematological: Negative for adenopathy.  Psychiatric/Behavioral:       Denies depression/anxiety   Past Medical History:  Diagnosis Date  . 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 Endometriosis   . Hyperlipidemia   . Migraines      Social History   Socioeconomic History  . Marital status: Married    Spouse Diaz: Not on file  . Number of children: 1  . Years of education: Not on file  . Highest education level: Not on file  Occupational History    Employer: [employer]  . Financial resource strain: Not on file  . Food insecurity:    Worry: Not on file    Inability: Not on file  . Transportation needs:    Medical: Not on file    Non-medical: Not on file  Tobacco Use  . Smoking status: Former Smoker    Years: 8.00    Last attempt to quit: 05/21/1990    Years since quitting: 28.1  . Smokeless tobacco: Never Used  Substance and Sexual Activity  . Alcohol use: No    Alcohol/week: 0.0 standard drinks  . Drug use: No  . Sexual activity: Not on file  Lifestyle  .  Physical activity:    Days per week: Not on file    Minutes per session: Not on file  . Stress: Not on file  Relationships  . Social connections:    Talks on phone: Not on file    Gets together: Not on file    Attends religious service: Not on file    Active member of club or organization: Not on file    Attends meetings of clubs or organizations: Not on file    Relationship status: Not on file  . Intimate partner violence:    Fear of current or ex partner: Not on file    Emotionally abused: Not on file    Physically abused: Not on file    Forced sexual activity: Not on file  Other Topics Concern  . Not on file  Social History Narrative   Caffeine:  1 cup coffee daily   Regular exercise:  Active work life. (walks a lot on job)   1 biological child, 2 step children   Works in Water engineer at [employer]   Married,    One daughter, 2 step daughters.                 Past Surgical History:  Procedure Laterality Date  . ABDOMINAL HYSTERECTOMY  05/21/90   partial hysterectomy. Has right ovary  . CESAREAN SECTION  1994  . ear  surgery  2000   repair of R TM  . WISDOM TOOTH EXTRACTION  1998    Family History  Problem Relation Age of Onset  . Diabetes Mother   . Heart disease Mother   . Heart disease Father 73  . Diabetes Maternal Grandfather   . Colon cancer Neg Hx     Allergies  Allergen Reactions  . Thorazine [Chlorpromazine Hcl] Other (See Comments)    Neurologic disturbance  . Codeine Itching  . Sulfa Drugs Cross Reactors Hives    Current Outpatient Medications on File Prior to Visit  Medication Sig Dispense Refill  . B Complex Vitamins (B COMPLEX PO) Take 1 capsule by mouth daily.    . Cholecalciferol (VITAMIN D3 PO) Take 1 capsule by mouth daily.    . COLLAGEN PO Take 1 capsule by mouth daily.    Marland Kitchen estrogens, conjugated, (PREMARIN) 0.625 MG tablet Take 0.625 mg by mouth daily. Take daily for 21 days then do not take for 7 days.    . fluticasone (FLONASE)  50 MCG/ACT nasal spray Place 2 sprays into both nostrils daily. 16 g 6  . Menaquinone-7 (VITAMIN K2 PO) Take 1 capsule by mouth daily.    Marland Kitchen UNABLE TO FIND Med Diaz: CBD OIL     No current facility-administered medications on file prior to visit.     BP 114/60 (BP Location: Right Arm, Patient Position: Sitting, Cuff Size: Small)   Pulse 68   Temp 98.5 F (36.9 C) (Oral)   Resp 16   Ht 5\' 1"  (1.549 m)   Wt 144 lb (65.3 kg)   SpO2 99%   BMI 27.21 kg/m       Objective:   Physical Exam Physical Exam  Constitutional: She is oriented to person, place, and time. She appears well-developed and well-nourished. No distress.  HENT:  Head: Normocephalic and atraumatic.  Right Ear: Tympanic membrane and ear canal normal.  Left Ear: Tympanic membrane and ear canal normal.  Mouth/Throat: Oropharynx is clear and moist.  Eyes: Pupils are equal, round, and reactive to light. No scleral icterus.  Neck: Normal range of motion. No thyromegaly present.  Cardiovascular: Normal rate and regular rhythm.   No murmur heard. Pulmonary/Chest: Effort normal and breath sounds normal. No respiratory distress. He has no wheezes. She has no rales. She exhibits no tenderness.  Abdominal: Soft. Bowel sounds are normal. She exhibits no distension and no mass. There is no tenderness. There is no rebound and no guarding.  Musculoskeletal: She exhibits no edema.  Lymphadenopathy:    She has no cervical adenopathy.  Neurological: She is alert and oriented to person, place, and time. She has normal patellar reflexes. She exhibits normal muscle tone. Coordination normal.  Skin: Skin is warm and dry.  Psychiatric: She has a normal mood and affect. Her behavior is normal. Judgment and thought content normal.  Breast/pelvic: deferred to GYN           Assessment & Plan:    Preventative Care- discussed healthy diet, exercise.  Obtain routine lab work. Shingrix #1 today.  Refer for dexa.   Hyperthyroid- has not  followed back up with endo- advised pt that I would recheck labs and if abnormal she will need to follow back up with Endo.       Assessment & Plan:

## 2018-06-27 NOTE — Patient Instructions (Signed)
Please schedule routine vision exam.  

## 2018-06-30 ENCOUNTER — Telehealth: Payer: Self-pay | Admitting: Family

## 2018-06-30 ENCOUNTER — Telehealth: Payer: Self-pay | Admitting: *Deleted

## 2018-06-30 NOTE — Telephone Encounter (Signed)
Please contact pt and let her know that her lab work is still showing that her thyroid is overactive. I would like her to schedule follow up with Dr. Cruzita Lederer.  Also, cholesterol is mildly elevated. Please work on low fat/low cholesterol diet and exercise.

## 2018-06-30 NOTE — Telephone Encounter (Signed)
Received Mammogram results from [facility] Radiology Department; forwarded to provider/SLS 02/10

## 2018-06-30 NOTE — Telephone Encounter (Signed)
Patient advised of results and to folllow up with Dr. Renne Crigler. She said she will call enco in the am.

## 2018-07-02 ENCOUNTER — Encounter: Payer: Self-pay | Admitting: Internal Medicine

## 2018-07-09 MED FILL — PREMARIN 0.45 MG TABLET: 0.45 | 30 days supply | Qty: 30 | Fill #0

## 2018-07-28 ENCOUNTER — Telehealth: Payer: Self-pay | Admitting: *Deleted

## 2018-07-28 NOTE — Telephone Encounter (Signed)
Pt received 1st Shingrix on 06/27/18 and will be due for 2nd vaccine 08/26/18 or after. Last OV says pt should return in 3 months for vaccine. Sent mychart message to call and schedule.

## 2018-08-07 MED FILL — PREMARIN 0.45 MG TABLET: 0.45 | 30 days supply | Qty: 30 | Fill #1

## 2018-08-12 ENCOUNTER — Encounter: Payer: Self-pay | Admitting: *Deleted

## 2018-09-08 MED FILL — PREMARIN 0.45 MG TABLET: 0.45 | 30 days supply | Qty: 30 | Fill #2

## 2018-09-16 ENCOUNTER — Encounter: Payer: Self-pay | Admitting: Family

## 2018-09-25 ENCOUNTER — Ambulatory Visit (INDEPENDENT_AMBULATORY_CARE_PROVIDER_SITE_OTHER): Payer: 59

## 2018-09-25 ENCOUNTER — Other Ambulatory Visit: Payer: Self-pay

## 2018-09-25 DIAGNOSIS — Z23 Encounter for immunization: Secondary | ICD-10-CM | POA: Diagnosis not present

## 2018-10-09 MED FILL — PREMARIN 0.45 MG TABLET: 0.45 | 30 days supply | Qty: 30 | Fill #3

## 2018-11-10 MED FILL — PREMARIN 0.45 MG TABLET: 0.45 | 30 days supply | Qty: 30 | Fill #4

## 2018-11-29 DIAGNOSIS — L309 Dermatitis, unspecified: Secondary | ICD-10-CM | POA: Diagnosis not present

## 2018-12-01 ENCOUNTER — Other Ambulatory Visit: Payer: Self-pay

## 2018-12-01 ENCOUNTER — Ambulatory Visit (INDEPENDENT_AMBULATORY_CARE_PROVIDER_SITE_OTHER)
Admission: RE | Admit: 2018-12-01 | Discharge: 2018-12-01 | Disposition: A | Discharge status: Home / Self Care | Payer: 59 | Source: Ambulatory Visit | Attending: Family | Admitting: Family

## 2018-12-01 DIAGNOSIS — E2839 Other primary ovarian failure: Secondary | ICD-10-CM

## 2018-12-01 MED FILL — CLOBETASOL PROPIONATE 0.05: 0.05 | 30 days supply | Qty: 60 | Fill #0

## 2018-12-05 ENCOUNTER — Encounter: Payer: Self-pay | Admitting: Family

## 2018-12-05 DIAGNOSIS — M8580 Other specified disorders of bone density and structure, unspecified site: Secondary | ICD-10-CM | POA: Insufficient documentation

## 2018-12-10 MED FILL — PREMARIN 0.45 MG TABLET: 0.45 | 30 days supply | Qty: 30 | Fill #5

## 2019-01-08 MED FILL — PREMARIN 0.45 MG TABLET: 0.45 | 30 days supply | Qty: 30 | Fill #6

## 2019-02-05 MED FILL — PREMARIN 0.45 MG TABLET: 0.45 | 30 days supply | Qty: 30 | Fill #7

## 2019-03-10 MED FILL — PREMARIN 0.45 MG TABLET: 0.45 | 30 days supply | Qty: 30 | Fill #8

## 2019-04-13 MED FILL — PREMARIN 0.45 MG TABLET: 0.45 | 30 days supply | Qty: 30 | Fill #9

## 2019-05-11 MED FILL — PREMARIN 0.45 MG TABLET: 0.45 | 30 days supply | Qty: 30 | Fill #10

## 2019-06-09 MED FILL — PREMARIN 0.45 MG TABLET: 0.45 | 30 days supply | Qty: 30 | Fill #11

## 2019-07-02 DIAGNOSIS — Z1231 Encounter for screening mammogram for malignant neoplasm of breast: Secondary | ICD-10-CM | POA: Diagnosis not present

## 2019-07-02 LAB — HM MAMMOGRAPHY

## 2019-07-15 MED FILL — PREMARIN 0.45 MG TABLET: 0.45 | 30 days supply | Qty: 30 | Fill #0

## 2019-09-04 ENCOUNTER — Other Ambulatory Visit: Payer: Self-pay

## 2019-09-04 ENCOUNTER — Ambulatory Visit (INDEPENDENT_AMBULATORY_CARE_PROVIDER_SITE_OTHER): Payer: 59 | Admitting: Family

## 2019-09-04 ENCOUNTER — Encounter: Payer: Self-pay | Admitting: Family

## 2019-09-04 VITALS — BP 136/73 | HR 59 | Temp 97.6°F | Resp 16 | Ht 61.0 in | Wt 147.0 lb

## 2019-09-04 DIAGNOSIS — Z0000 Encounter for general adult medical examination without abnormal findings: Secondary | ICD-10-CM | POA: Diagnosis not present

## 2019-09-04 DIAGNOSIS — E0590 Thyrotoxicosis, unspecified without thyrotoxic crisis or storm: Secondary | ICD-10-CM | POA: Diagnosis not present

## 2019-09-04 LAB — CBC WITH DIFFERENTIAL/PLATELET
Basophils Absolute: 0 10*3/uL (ref 0.0–0.1)
Basophils Relative: 0.7 % (ref 0.0–3.0)
Eosinophils Absolute: 0.1 10*3/uL (ref 0.0–0.7)
Eosinophils Relative: 2.7 % (ref 0.0–5.0)
HCT: 39.4 % (ref 36.0–46.0)
Hemoglobin: 13.4 g/dL (ref 12.0–15.0)
Lymphocytes Relative: 36.6 % (ref 12.0–46.0)
Lymphs Abs: 1.6 10*3/uL (ref 0.7–4.0)
MCHC: 33.9 g/dL (ref 30.0–36.0)
MCV: 88.3 fl (ref 78.0–100.0)
Monocytes Absolute: 0.3 10*3/uL (ref 0.1–1.0)
Monocytes Relative: 6.8 % (ref 3.0–12.0)
Neutro Abs: 2.4 10*3/uL (ref 1.4–7.7)
Neutrophils Relative %: 53.2 % (ref 43.0–77.0)
Platelets: 282 10*3/uL (ref 150.0–400.0)
RBC: 4.46 Mil/uL (ref 3.87–5.11)
RDW: 12.7 % (ref 11.5–15.5)
WBC: 4.4 10*3/uL (ref 4.0–10.5)

## 2019-09-04 LAB — HEPATIC FUNCTION PANEL
ALT: 14 U/L (ref 0–35)
AST: 18 U/L (ref 0–37)
Albumin: 4.3 g/dL (ref 3.5–5.2)
Alkaline Phosphatase: 75 U/L (ref 39–117)
Bilirubin, Direct: 0.1 mg/dL (ref 0.0–0.3)
Total Bilirubin: 0.5 mg/dL (ref 0.2–1.2)
Total Protein: 6.9 g/dL (ref 6.0–8.3)

## 2019-09-04 LAB — BASIC METABOLIC PANEL
BUN: 12 mg/dL (ref 6–23)
CO2: 28 mEq/L (ref 19–32)
Calcium: 9.6 mg/dL (ref 8.4–10.5)
Chloride: 105 mEq/L (ref 96–112)
Creatinine, Ser: 0.71 mg/dL (ref 0.40–1.20)
GFR: 84.2 mL/min (ref >60.00)
Glucose, Bld: 91 mg/dL (ref 70–99)
Potassium: 4.5 mEq/L (ref 3.5–5.1)
Sodium: 140 mEq/L (ref 135–145)

## 2019-09-04 LAB — LIPID PANEL
Cholesterol: 194 mg/dL (ref 0–200)
HDL: 77.7 mg/dL (ref >39.00)
LDL Cholesterol: 102 mg/dL — ABNORMAL HIGH (ref 0–99)
NonHDL: 116.36
Total CHOL/HDL Ratio: 2
Triglycerides: 70 mg/dL (ref 0.0–149.0)
VLDL: 14 mg/dL (ref 0.0–40.0)

## 2019-09-04 LAB — TSH: TSH: 1.51 u[IU]/mL (ref 0.35–4.50)

## 2019-09-04 MED ORDER — ESTROGENS CONJUGATED 0.3 MG PO TABS: 0.3000 mg | ORAL_TABLET | Freq: Every day | ORAL | 5 refills | Status: DC

## 2019-09-04 MED FILL — PREMARIN 0.3 MG TABLET: 0.3 | 30 days supply | Qty: 30 | Fill #0

## 2019-09-04 NOTE — Patient Instructions (Addendum)
Please schedule a follow up appointment with Dr. Cruzita Lederer. Please schedule a routine eye exam.  Preventive Care 27-59 Years Old, Female Preventive care refers to visits with your health care provider and lifestyle choices that can promote health and wellness. This includes:  A yearly physical exam. This may also be called an annual well check.  Regular dental visits and eye exams.  Immunizations.  Screening for certain conditions.  Healthy lifestyle choices, such as eating a healthy diet, getting regular exercise, not using drugs or products that contain nicotine and tobacco, and limiting alcohol use. What can I expect for my preventive care visit? Physical exam Your health care provider will check your:  Height and weight. This may be used to calculate body mass index (BMI), which tells if you are at a healthy weight.  Heart rate and blood pressure.  Skin for abnormal spots. Counseling Your health care provider may ask you questions about your:  Alcohol, tobacco, and drug use.  Emotional well-being.  Home and relationship well-being.  Sexual activity.  Eating habits.  Work and work Statistician.  Method of birth control.  Menstrual cycle.  Pregnancy history. What immunizations do I need?  Influenza (flu) vaccine  This is recommended every year. Tetanus, diphtheria, and pertussis (Tdap) vaccine  You may need a Td booster every 10 years. Varicella (chickenpox) vaccine  You may need this if you have not been vaccinated. Zoster (shingles) vaccine  You may need this after age 72. Measles, mumps, and rubella (MMR) vaccine  You may need at least one dose of MMR if you were born in 1957 or later. You may also need a second dose. Pneumococcal conjugate (PCV13) vaccine  You may need this if you have certain conditions and were not previously vaccinated. Pneumococcal polysaccharide (PPSV23) vaccine  You may need one or two doses if you smoke cigarettes or if you  have certain conditions. Meningococcal conjugate (MenACWY) vaccine  You may need this if you have certain conditions. Hepatitis A vaccine  You may need this if you have certain conditions or if you travel or work in places where you may be exposed to hepatitis A. Hepatitis B vaccine  You may need this if you have certain conditions or if you travel or work in places where you may be exposed to hepatitis B. Haemophilus influenzae type b (Hib) vaccine  You may need this if you have certain conditions. Human papillomavirus (HPV) vaccine  If recommended by your health care provider, you may need three doses over 6 months. You may receive vaccines as individual doses or as more than one vaccine together in one shot (combination vaccines). Talk with your health care provider about the risks and benefits of combination vaccines. What tests do I need? Blood tests  Lipid and cholesterol levels. These may be checked every 5 years, or more frequently if you are over 6 years old.  Hepatitis C test.  Hepatitis B test. Screening  Lung cancer screening. You may have this screening every year starting at age 65 if you have a 30-pack-year history of smoking and currently smoke or have quit within the past 15 years.  Colorectal cancer screening. All adults should have this screening starting at age 67 and continuing until age 58. Your health care provider may recommend screening at age 2 if you are at increased risk. You will have tests every 1-10 years, depending on your results and the type of screening test.  Diabetes screening. This is done by checking your  blood sugar (glucose) after you have not eaten for a while (fasting). You may have this done every 1-3 years.  Mammogram. This may be done every 1-2 years. Talk with your health care provider about when you should start having regular mammograms. This may depend on whether you have a family history of breast cancer.  BRCA-related cancer  screening. This may be done if you have a family history of breast, ovarian, tubal, or peritoneal cancers.  Pelvic exam and Pap test. This may be done every 3 years starting at age 74. Starting at age 80, this may be done every 5 years if you have a Pap test in combination with an HPV test. Other tests  Sexually transmitted disease (STD) testing.  Bone density scan. This is done to screen for osteoporosis. You may have this scan if you are at high risk for osteoporosis. Follow these instructions at home: Eating and drinking  Eat a diet that includes fresh fruits and vegetables, whole grains, lean protein, and low-fat dairy.  Take vitamin and mineral supplements as recommended by your health care provider.  Do not drink alcohol if: ? Your health care provider tells you not to drink. ? You are pregnant, may be pregnant, or are planning to become pregnant.  If you drink alcohol: ? Limit how much you have to 0-1 drink a day. ? Be aware of how much alcohol is in your drink. In the [place] one drink equals one 12 oz bottle of beer (355 mL), one 5 oz glass of wine (148 mL), or one 1 oz glass of hard liquor (44 mL). Lifestyle  Take daily care of your teeth and gums.  Stay active. Exercise for at least 30 minutes on 5 or more days each week.  Do not use any products that contain nicotine or tobacco, such as cigarettes, e-cigarettes, and chewing tobacco. If you need help quitting, ask your health care provider.  If you are sexually active, practice safe sex. Use a condom or other form of birth control (contraception) in order to prevent pregnancy and STIs (sexually transmitted infections).  If told by your health care provider, take low-dose aspirin daily starting at age 77. What's next?  Visit your health care provider once a year for a well check visit.  Ask your health care provider how often you should have your eyes and teeth checked.  Stay up to date on all vaccines. This  information is not intended to replace advice given to you by your health care provider. Make sure you discuss any questions you have with your health care provider. Document Revised: 01/16/2018 Document Reviewed: 01/16/2018 Elsevier Patient Education  2020 [employer].

## 2019-09-04 NOTE — Progress Notes (Signed)
Subjective:     Patient ID: Erica Diaz, female   DOB: [birth year], 60 y.o.   MRN: [id number]  HPI  Patient is a 59 yr old female who presents today for cpx.  Patient presents today for complete physical.  Immunizations: shingrix up to date, tdap up to date,  She does not plan to receive the covid vaccine Diet: healthy  Wt Readings from Last 3 Encounters:  09/04/19 147 lb (66.7 kg)  06/27/18 144 lb (65.3 kg)  04/09/18 147 lb (66.7 kg)  Exercise: active at work Colonoscopy: 10/08/14, due 2026 Dexa: 2020- osteopenia  Pap Smear: hysterectomy Mammogram: 07/02/19 Vision:  due Dental: up to date  Lab Results  Component Value Date   TSH 0.04 (L) 06/27/2018   She reports a lot of stress recently due to some deaths in the family and a very ill brother who has neck cancer.   Review of Systems  Constitutional: Negative for unexpected weight change.  HENT: Negative for hearing loss and rhinorrhea.   Eyes: Negative for visual disturbance.  Respiratory: Negative for cough and shortness of breath.   Cardiovascular: Negative for chest pain.  Gastrointestinal: Negative for constipation and diarrhea.  Genitourinary: Negative for dysuria, frequency and hematuria.  Musculoskeletal: Negative for arthralgias and myalgias.  Skin: Negative for rash.  Neurological: Negative for headaches.  Hematological: Negative for adenopathy.  Psychiatric/Behavioral:       Denies depression/anxiety   Past Medical History:  Diagnosis Date  . 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 Endometriosis   . Hyperlipidemia   . Migraines      Social History   Socioeconomic History  . Marital status: Married    Spouse name: Not on file  . Number of children: 1  . Years of education: Not on file  . Highest education level: Not on file  Occupational History    Employer: [employer]  Tobacco Use  . Smoking status: Former Smoker    Years: 8.00    Quit date: 05/21/1990    Years since quitting: 29.3   . Smokeless tobacco: Never Used  Substance and Sexual Activity  . Alcohol use: No    Alcohol/week: 0.0 standard drinks  . Drug use: No  . Sexual activity: Not on file  Other Topics Concern  . Not on file  Social History Narrative   Caffeine:  1 cup coffee daily   Regular exercise:  Active work life. (walks a lot on job)   1 biological child, 2 step children   Works in Water engineer at [employer]   Married,    One daughter, 2 step daughters.                Social Determinants of Health   Financial Resource Strain:   . Difficulty of Paying Living Expenses:   Food Insecurity:   . Worried About Charity fundraiser in the Last Year:   . Arboriculturist in the Last Year:   Transportation Needs:   . Film/video editor (Medical):   Marland Kitchen Lack of Transportation (Non-Medical):   Physical Activity:   . Days of Exercise per Week:   . Minutes of Exercise per Session:   Stress:   . Feeling of Stress :   Social Connections:   . Frequency of Communication with Friends and Family:   . Frequency of Social Gatherings with Friends and Family:   . Attends Religious Services:   . Active Member of Clubs or Organizations:   .  Attends Archivist Meetings:   Marland Kitchen Marital Status:   Intimate Partner Violence:   . Fear of Current or Ex-Partner:   . Emotionally Abused:   Marland Kitchen Physically Abused:   . Sexually Abused:     Past Surgical History:  Procedure Laterality Date  . ABDOMINAL HYSTERECTOMY  05/21/90   partial hysterectomy. Has right ovary  . CESAREAN SECTION  1994  . ear surgery  2000   repair of R TM  . WISDOM TOOTH EXTRACTION  1998    Family History  Problem Relation Age of Onset  . Diabetes Mother   . Heart disease Mother   . Heart disease Father 68  . Diabetes Maternal Grandfather   . Heart failure Brother   . Throat cancer Brother   . Peripheral vascular disease Brother        ?PVD-had partial amputation  . COPD Brother   . Drug abuse Brother   . CVA Sister    . Colon cancer Neg Hx     Allergies  Allergen Reactions  . Thorazine [Chlorpromazine Hcl] Other (See Comments)    Neurologic disturbance  . Codeine Itching  . Sulfa Drugs Cross Reactors Hives    Current Outpatient Medications on File Prior to Visit  Medication Sig Dispense Refill  . Calcium Carbonate-Vit D-Min (CALCIUM 1200 PO) Take by mouth.    . COLLAGEN PO Take 1 capsule by mouth daily.    . fluticasone (FLONASE) 50 MCG/ACT nasal spray Place 2 sprays into both nostrils daily. 16 g 6  . Probiotic Product (ALIGN PO) Take by mouth.     No current facility-administered medications on file prior to visit.    BP 136/73 (BP Location: Right Arm, Patient Position: Sitting, Cuff Size: Small)   Pulse (!) 59   Temp 97.6 F (36.4 C) (Temporal)   Resp 16   Ht 5\' 1"  (1.549 m)   Wt 147 lb (66.7 kg)   SpO2 100%   BMI 27.78 kg/m       Objective:   Physical Exam Physical Exam  Constitutional: She is oriented to person, place, and time. She appears well-developed and well-nourished. No distress.  HENT:  Head: Normocephalic and atraumatic.  Right Ear: Tympanic membrane and ear canal normal.  Left Ear: Tympanic membrane and ear canal normal.  Mouth/Throat: Not examined- pt wearing mask Eyes: Pupils are equal, round, and reactive to light. No scleral icterus.  Neck: Normal range of motion. No thyromegaly present.  Cardiovascular: Normal rate and regular rhythm.   No murmur heard. Pulmonary/Chest: Effort normal and breath sounds normal. No respiratory distress. He has no wheezes. She has no rales. She exhibits no tenderness.  Abdominal: Soft. Bowel sounds are normal. She exhibits no distension and no mass. There is no tenderness. There is no rebound and no guarding.  Musculoskeletal: She exhibits no edema.  Lymphadenopathy:    She has no cervical adenopathy.  Neurological: She is alert and oriented to person, place, and time. She has normal patellar reflexes. She exhibits normal  muscle tone. Coordination normal.  Skin: Skin is warm and dry.  Psychiatric: She has a normal mood and affect. Her behavior is normal. Judgment and thought content normal.  Breast/Pelvic: deferred      Assessment & Plan:   Preventative care- discussed healthy diet, exercise. Mammo/colo/dexa up to date.  Obtain routine lab work.    Hyperthyroid- check tsh- she is advised to schedule a follow up appointment with her endocrinologist.     Assessment:  Plan:

## 2019-11-26 MED FILL — PREMARIN 0.3 MG TABLET: 0.3 | 30 days supply | Qty: 30 | Fill #3

## 2019-12-31 MED FILL — PREMARIN 0.3 MG TABLET: 0.3 | 30 days supply | Qty: 30 | Fill #4

## 2020-02-01 MED FILL — PREMARIN 0.3 MG TABLET: 0.3 | 30 days supply | Qty: 30 | Fill #5

## 2020-03-02 ENCOUNTER — Other Ambulatory Visit: Payer: Self-pay | Admitting: Family

## 2020-03-02 MED FILL — PREMARIN 0.3 MG TABLET: 0.3 | 30 days supply | Qty: 30 | Fill #0

## 2020-04-05 MED FILL — PREMARIN 0.3 MG TABLET: 0.3 | 30 days supply | Qty: 30 | Fill #1

## 2020-05-04 MED FILL — PREMARIN 0.3 MG TABLET: 0.3 | 30 days supply | Qty: 30 | Fill #2

## 2020-06-07 MED FILL — PREMARIN 0.3 MG TABLET: 0.3 | 30 days supply | Qty: 30 | Fill #3

## 2020-06-21 ENCOUNTER — Encounter: Payer: Self-pay | Admitting: Emergency Medicine

## 2020-06-21 ENCOUNTER — Emergency Department
Admission: EM | Admit: 2020-06-21 | Discharge: 2020-06-21 | Disposition: A | Discharge status: Home / Self Care | Payer: 59 | Source: Home / Self Care

## 2020-06-21 ENCOUNTER — Other Ambulatory Visit: Payer: Self-pay

## 2020-06-21 ENCOUNTER — Other Ambulatory Visit: Payer: Self-pay | Admitting: Family Medicine

## 2020-06-21 DIAGNOSIS — B349 Viral infection, unspecified: Secondary | ICD-10-CM | POA: Diagnosis not present

## 2020-06-21 DIAGNOSIS — U071 COVID-19: Secondary | ICD-10-CM

## 2020-06-21 LAB — POC SARS CORONAVIRUS 2 AG -  ED: SARS Coronavirus 2 Ag: POSITIVE — AB

## 2020-06-21 MED ORDER — LEVOCETIRIZINE DIHYDROCHLORIDE 5 MG PO TABS: 5.0000 mg | ORAL_TABLET | Freq: Every evening | ORAL | 0 refills | Status: DC

## 2020-06-21 MED ORDER — PREDNISONE 20 MG PO TABS: 20.0000 mg | ORAL_TABLET | Freq: Every day | ORAL | 0 refills | Status: DC

## 2020-06-21 MED ORDER — METOCLOPRAMIDE HCL 5 MG/ML IJ SOLN
10.0000 mg | Freq: Once | INTRAMUSCULAR | Status: AC
  Administered 2020-06-21: 10 mg via INTRAMUSCULAR

## 2020-06-21 MED ORDER — KETOROLAC TROMETHAMINE 30 MG/ML IJ SOLN
30.0000 mg | Freq: Once | INTRAMUSCULAR | Status: AC
  Administered 2020-06-21: 30 mg via INTRAMUSCULAR

## 2020-06-21 MED ORDER — ONDANSETRON HCL 4 MG PO TABS: 4.0000 mg | ORAL_TABLET | Freq: Four times a day (QID) | ORAL | 0 refills | Status: DC

## 2020-06-21 MED ORDER — IPRATROPIUM BROMIDE 0.03 % NA SOLN: 2.0000 | Freq: Three times a day (TID) | NASAL | 0 refills | Status: DC | PRN

## 2020-06-21 MED FILL — predniSONE 20 MG TABS: 20 | 5 days supply | Qty: 5 | Fill #0

## 2020-06-21 MED FILL — LEVOCETIRIZINE 5 MG TABLET: 5 | 30 days supply | Qty: 30 | Fill #0

## 2020-06-21 MED FILL — ONDANSETRON HCL 4 MG TABLET: 4 | 5 days supply | Qty: 20 | Fill #0

## 2020-06-21 MED FILL — IPRATROPIUM 0.03% SPRAY: 0.03 | 35 days supply | Qty: 30 | Fill #0

## 2020-06-21 NOTE — Discharge Instructions (Signed)
I have prescribed Atrovent nasal spray and Xyzal take as directed to manage her nasal symptoms.  I am also start you on a low-dose of oral steroids to help with sinus pressure and help with mild shortness of breath.  You will need to take this with a small amount of food therefore of also prescribed you Zofran to help with your nausea symptoms. If you develop any severe chest pain or severe shortness of breath go immediately to the emergency department for further work-up and evaluation.  I have also included a work note.  Notify  health at work of your diagnosis here in clinic today.

## 2020-06-21 NOTE — ED Triage Notes (Signed)
Headache & nausea w/ night sweats  Started on Sunday  OTC tylenol or excedrine migraine NO COVID vaccine  Did have a flu vaccine  Works at [employer]

## 2020-06-21 NOTE — ED Provider Notes (Signed)
[facility]    CSN: [id number] Arrival date & time: 06/21/20  1046      History   Chief Complaint Chief Complaint  Patient presents with  . Nausea  . Headache    HPI Erica Diaz is a 60 y.o. female.   HPI Patient presents today for evaluation of possible COVID-19 infection.  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  Patient reports acute onset of headache, body aches, nasal congestion and drainage, severe nausea and diminished appetite.  She is unaware she has had fever.  She has 1 PPD but has had close exposure to people who are positive for COVID-19. Past Medical History:  Diagnosis Date  . 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 Endometriosis   . Hyperlipidemia   . Migraines     Patient Active Problem List   Diagnosis Date Noted  . Osteopenia 12/05/2018  . 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 Routine general medical examination at a health care facility 06/10/2014  . Menopausal syndrome 07/04/2011    Past Surgical History:  Procedure Laterality Date  . ABDOMINAL HYSTERECTOMY  05/21/90   partial hysterectomy. Has right ovary  . CESAREAN SECTION  1994  . ear surgery  2000   repair of R TM  . WISDOM TOOTH EXTRACTION  1998    OB History   No obstetric history on file.      Home Medications    Prior to Admission medications   Medication Sig Start Date End Date Taking? Authorizing Provider  PREMARIN 0.3 MG tablet TAKE 1 TABLET BY MOUTH ONCE DAILY 03/02/20  Yes [name], NP  Calcium Carbonate-Vit D-Min (CALCIUM 1200 PO) Take by mouth. Patient not taking: Reported on 06/21/2020    [provider]  COLLAGEN PO Take 1 capsule by mouth daily. Patient not taking: Reported on 06/21/2020    [provider]  fluticasone (FLONASE) 50 MCG/ACT nasal spray Place 2 sprays into both nostrils daily. Patient not taking: Reported on 06/21/2020 04/09/18   Colon Branch, MD  Probiotic Product (ALIGN  PO) Take by mouth. Patient not taking: Reported on 06/21/2020    [provider]    Family History Family History  Problem Relation Age of Onset  . Diabetes Mother   . Heart disease Mother   . Heart disease Father 67  . Diabetes Maternal Grandfather   . Heart failure Brother   . Throat cancer Brother   . Peripheral vascular disease Brother        ?PVD-had partial amputation  . COPD Brother   . Drug abuse Brother   . CVA Sister   . Colon cancer Neg Hx     Social History Social History   Tobacco Use  . Smoking status: Former Smoker    Years: 8.00    Quit date: 05/21/1990    Years since quitting: 30.1  . Smokeless tobacco: Never Used  Vaping Use  . Vaping Use: Never used  Substance Use Topics  . Alcohol use: No    Alcohol/week: 0.0 standard drinks  . Drug use: No     Allergies   Thorazine [chlorpromazine hcl], Codeine, and Sulfa drugs cross reactors  Review of Systems Review of Systems Pertinent negatives listed in HPI  Physical Exam Triage Vital Signs ED Triage Vitals  Enc Vitals Group     BP 06/21/20 1102 136/85     Pulse Rate 06/21/20 1102 82     Resp 06/21/20  1102 16     Temp 06/21/20 1102 99.3 F (37.4 C)     Temp Source 06/21/20 1102 Oral     SpO2 06/21/20 1102 96 %     Weight 06/21/20 1107 149 lb (67.6 kg)     Height 06/21/20 1107 5\' 1"  (1.549 m)     Head Circumference --      Peak Flow --      Pain Score 06/21/20 1104 4     Pain Loc --      Pain Edu? --      Excl. in [place]? --    No data found.  Updated Vital Signs BP 136/85 (BP Location: Right Arm)   Pulse 82   Temp 99.3 F (37.4 C) (Oral)   Resp 16   Ht 5\' 1"  (1.549 m)   Wt 149 lb (67.6 kg)   SpO2 96%   BMI 28.15 kg/m   Visual Acuity Right Eye Distance:   Left Eye Distance:   Bilateral Distance:    Right Eye Near:   Left Eye Near:    Bilateral Near:     Physical Exam HENT:     Head: Normocephalic.  Cardiovascular:     Rate and Rhythm: Normal rate.  Pulmonary:      Effort: Pulmonary effort is normal.     Breath sounds: Normal breath sounds.  Abdominal:     General: Abdomen is flat.     Palpations: Abdomen is soft.     Comments: Diminished BS  Musculoskeletal:     Cervical back: Normal range of motion.  Lymphadenopathy:     Cervical: No cervical adenopathy.  Neurological:     Mental Status: She is alert.     GCS: GCS eye subscore is 4. GCS verbal subscore is 5. GCS motor subscore is 6.  Psychiatric:        Attention and Perception: Attention normal.        Behavior: Behavior normal.        Cognition and Memory: Cognition normal.      UC Treatments / Results  Labs (all labs ordered are listed, but only abnormal results are displayed) Labs Reviewed - No data to display  EKG   Radiology No results found.  Procedures Procedures (including critical care time)  Medications Ordered in UC Medications - No data to display  Initial Impression / Assessment and Plan / UC Course  I have reviewed the triage vital signs and the nursing notes.  Pertinent labs & imaging results that were available during my care of the patient were reviewed by me and considered in my medical decision making (see chart for details).     Patient is COVID-19 positive per point-of-care test here in clinic today.  Headache cocktail administered here in clinic today which included Reglan IM and Toradol IM.  Discharging home with oral prednisone to help with sinus congestion and some mild shortness of breath.  Zofran for management of nausea.  Patient is unvaccinated therefore strict ER precautions given if any of her symptoms severely worsen.  Patient is also a [employer] employee she was advised to contact health at work immediately for direction employee requirements for returning to work.  Patient verbalized understanding and agreement with plan.  Final Clinical Impressions(s) / UC Diagnoses   Final diagnoses:  Viral illness  Close exposure to COVID-19 virus      Discharge Instructions     I have prescribed Atrovent nasal spray and Xyzal take as directed to  manage her nasal symptoms.  I am also start you on a low-dose of oral steroids to help with sinus pressure and help with mild shortness of breath.  You will need to take this with a small amount of food therefore of also prescribed you Zofran to help with your nausea symptoms. If you develop any severe chest pain or severe shortness of breath go immediately to the emergency department for further work-up and evaluation.  I have also included a work note.  Notify  health at work of your diagnosis here in clinic today.    ED Prescriptions    Medication Sig Dispense Auth. Provider   predniSONE (DELTASONE) 20 MG tablet Take 1 tablet (20 mg total) by mouth daily with breakfast for 5 days. 5 tablet Scot Jun, FNP   levocetirizine (XYZAL) 5 MG tablet Take 1 tablet (5 mg total) by mouth every evening. 30 tablet Scot Jun, FNP   ondansetron (ZOFRAN) 4 MG tablet Take 1 tablet (4 mg total) by mouth every 6 (six) hours. 20 tablet Scot Jun, FNP   ipratropium (ATROVENT) 0.03 % nasal spray Place 2 sprays into both nostrils 3 (three) times daily as needed for rhinitis. 30 mL Scot Jun, FNP     PDMP not reviewed this encounter.   Scot Jun, FNP 06/21/20 1151

## 2020-06-22 ENCOUNTER — Telehealth: Payer: Self-pay | Admitting: Nurse Practitioner

## 2020-06-22 NOTE — Telephone Encounter (Signed)
I connected by phone with Erica Diaz on 06/22/2020 at 11:59 AM to discuss the potential use of a new treatment for mild to moderate COVID-19 viral infection in non-hospitalized patients.  This patient is a 60 y.o. female that meets the FDA criteria for Emergency Use Authorization of COVID monoclonal antibody sotrovimab.  Has a (+) direct SARS-CoV-2 viral test result  Has mild or moderate COVID-19   Is NOT hospitalized due to COVID-19  Is within 10 days of symptom onset  Has at least one of the high risk factor(s) for progression to severe COVID-19 and/or hospitalization as defined in EUA.  Specific high risk criteria : BMI > 25 and Other high risk medical condition per CDC:  unvaccinated   I have spoken and communicated the following to the patient or parent/caregiver regarding COVID monoclonal antibody treatment:  Patient stated she was feeling better and declined to discuss treatment options. Reviewed isolation guidelines and symptoms to monitor for. Encouraged to return to Urgent Care or ED for worsening symptoms.   After reviewing this information with the patient, the patient has DECLINED offer to receive the infusion. Payton Emerald, NP 06/22/2020 11:59 AM

## 2020-06-24 ENCOUNTER — Telehealth: Payer: Self-pay

## 2020-06-24 NOTE — Telephone Encounter (Signed)
Pt called for information about the shots she was given at [facility] visit. Informed she was given Toradol and Reglan at visit. All questions answered.

## 2020-07-06 MED FILL — PREMARIN 0.3 MG TABLET: 0.3 | 30 days supply | Qty: 30 | Fill #4

## 2020-08-07 LAB — HM MAMMOGRAPHY

## 2020-08-08 MED FILL — PREMARIN 0.3 MG TABLET: 0.3 | 30 days supply | Qty: 30 | Fill #5

## 2020-08-11 ENCOUNTER — Other Ambulatory Visit (HOSPITAL_BASED_OUTPATIENT_CLINIC_OR_DEPARTMENT_OTHER): Payer: Self-pay

## 2020-08-11 DIAGNOSIS — Z1231 Encounter for screening mammogram for malignant neoplasm of breast: Secondary | ICD-10-CM | POA: Diagnosis not present

## 2020-08-15 ENCOUNTER — Other Ambulatory Visit (HOSPITAL_BASED_OUTPATIENT_CLINIC_OR_DEPARTMENT_OTHER): Payer: Self-pay

## 2020-09-06 ENCOUNTER — Other Ambulatory Visit (HOSPITAL_COMMUNITY): Payer: Self-pay

## 2020-09-06 ENCOUNTER — Other Ambulatory Visit: Payer: Self-pay | Admitting: Family

## 2020-09-06 MED ORDER — ESTROGENS CONJUGATED 0.3 MG PO TABS
ORAL_TABLET | Freq: Every day | ORAL | 5 refills | Status: DC
  Filled 2020-09-06: qty 30, 30d supply, fill #0
  Filled 2020-10-08: qty 30, 30d supply, fill #1
  Filled 2020-11-04: qty 30, 30d supply, fill #2
  Filled 2020-12-06: qty 30, 30d supply, fill #3
  Filled 2021-01-04: qty 30, 30d supply, fill #4
  Filled 2021-02-05: qty 30, 30d supply, fill #5

## 2020-09-13 ENCOUNTER — Other Ambulatory Visit: Payer: Self-pay

## 2020-09-13 ENCOUNTER — Encounter: Payer: Self-pay | Admitting: Family

## 2020-09-13 ENCOUNTER — Ambulatory Visit (INDEPENDENT_AMBULATORY_CARE_PROVIDER_SITE_OTHER): Payer: 59 | Admitting: Family

## 2020-09-13 VITALS — BP 140/76 | HR 56 | Temp 97.8°F | Resp 18 | Ht 61.0 in | Wt 149.4 lb

## 2020-09-13 DIAGNOSIS — E785 Hyperlipidemia, unspecified: Secondary | ICD-10-CM | POA: Diagnosis not present

## 2020-09-13 DIAGNOSIS — Z0000 Encounter for general adult medical examination without abnormal findings: Secondary | ICD-10-CM | POA: Diagnosis not present

## 2020-09-13 DIAGNOSIS — E0590 Thyrotoxicosis, unspecified without thyrotoxic crisis or storm: Secondary | ICD-10-CM | POA: Diagnosis not present

## 2020-09-13 DIAGNOSIS — Z23 Encounter for immunization: Secondary | ICD-10-CM | POA: Diagnosis not present

## 2020-09-13 LAB — COMPREHENSIVE METABOLIC PANEL
ALT: 12 U/L (ref 0–35)
AST: 15 U/L (ref 0–37)
Albumin: 4.1 g/dL (ref 3.5–5.2)
Alkaline Phosphatase: 67 U/L (ref 39–117)
BUN: 15 mg/dL (ref 6–23)
CO2: 26 mEq/L (ref 19–32)
Calcium: 9.5 mg/dL (ref 8.4–10.5)
Chloride: 106 mEq/L (ref 96–112)
Creatinine, Ser: 0.74 mg/dL (ref 0.40–1.20)
GFR: 88.05 mL/min (ref >60.00)
Glucose, Bld: 86 mg/dL (ref 70–99)
Potassium: 4.2 mEq/L (ref 3.5–5.1)
Sodium: 140 mEq/L (ref 135–145)
Total Bilirubin: 0.6 mg/dL (ref 0.2–1.2)
Total Protein: 6.8 g/dL (ref 6.0–8.3)

## 2020-09-13 LAB — LIPID PANEL
Cholesterol: 193 mg/dL (ref 0–200)
HDL: 72.3 mg/dL (ref >39.00)
LDL Cholesterol: 106 mg/dL — ABNORMAL HIGH (ref 0–99)
NonHDL: 120.6
Total CHOL/HDL Ratio: 3
Triglycerides: 72 mg/dL (ref 0.0–149.0)
VLDL: 14.4 mg/dL (ref 0.0–40.0)

## 2020-09-13 LAB — TSH: TSH: 2.45 u[IU]/mL (ref 0.35–4.50)

## 2020-09-13 NOTE — Progress Notes (Signed)
Subjective:    Patient ID: Erica Diaz, female    DOB: 1960/06/01, 60 y.o.   MRN: [id number]  HPI  Patient presents today for complete physical.  Immunizations: Td due, declines immunization Diet:  Reports diet is healthy Exercise: swims in the summer, yard work Colonoscopy:  10/08/2014 Pap Smear: s/p hysterectomy Mammogram: 3/20 Vision: up to date Dental: up to date    Review of Systems  Constitutional: Negative for unexpected weight change.  HENT: Negative for hearing loss and rhinorrhea.   Eyes: Negative for visual disturbance.  Respiratory: Negative for cough and shortness of breath.   Cardiovascular: Negative for chest pain.  Gastrointestinal: Negative for constipation and diarrhea.  Genitourinary: Negative for dysuria and frequency.  Musculoskeletal: Negative for arthralgias and myalgias.  Skin: Negative for rash.  Neurological: Negative for headaches.  Hematological: Negative for adenopathy.  Psychiatric/Behavioral:       Denies depression/anxiety   Past Medical History:  Diagnosis Date  . 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 Endometriosis   . Hyperlipidemia   . Migraines      Social History   Socioeconomic History  . Marital status: Married    Spouse name: Not on file  . Number of children: 1  . Years of education: Not on file  . Highest education level: Not on file  Occupational History    Employer: [employer]  Tobacco Use  . Smoking status: Former Smoker    Years: 8.00    Quit date: 05/21/1990    Years since quitting: 30.3  . Smokeless tobacco: Never Used  Vaping Use  . Vaping Use: Never used  Substance and Sexual Activity  . Alcohol use: No    Alcohol/week: 0.0 standard drinks  . Drug use: No  . Sexual activity: Not on file  Other Topics Concern  . Not on file  Social History Narrative   Caffeine:  1 cup coffee daily   Regular exercise:  Active work life. (walks a lot on job)   1 biological child, 2 step children   Works  in Water engineer at [employer]   Married,    One daughter, 2 step daughters.                Social Determinants of Health   Financial Resource Strain: Not on file  Food Insecurity: Not on file  Transportation Needs: Not on file  Physical Activity: Not on file  Stress: Not on file  Social Connections: Not on file  Intimate Partner Violence: Not on file    Past Surgical History:  Procedure Laterality Date  . ABDOMINAL HYSTERECTOMY  05/21/90   partial hysterectomy. Has right ovary  . CESAREAN SECTION  1994  . ear surgery  2000   repair of R TM  . WISDOM TOOTH EXTRACTION  1998    Family History  Problem Relation Age of Onset  . Diabetes Mother   . Heart disease Mother   . Heart disease Father 57  . Dementia Father   . Diabetes Maternal Grandfather   . Heart failure Brother   . Throat cancer Brother   . Peripheral vascular disease Brother        ?PVD-had partial amputation  . COPD Brother   . Drug abuse Brother   . CVA Sister   . Colon cancer Neg Hx     Allergies  Allergen Reactions  . Thorazine [Chlorpromazine Hcl] Other (See Comments)    Neurologic disturbance  . Codeine Itching  .  Sulfa Drugs Cross Reactors Hives    Current Outpatient Medications on File Prior to Visit  Medication Sig Dispense Refill  . Calcium Carbonate-Vit D-Min (CALCIUM 1200 PO) Take by mouth.    . COLLAGEN PO Take 1 capsule by mouth daily.    Marland Kitchen estrogens, conjugated, (PREMARIN) 0.3 MG tablet TAKE 1 TABLET BY MOUTH ONCE DAILY 30 tablet 5  . levocetirizine (XYZAL) 5 MG tablet TAKE 1 TABLET BY MOUTH EVERY EVENING 30 tablet 0  . Probiotic Product (ALIGN PO) Take by mouth.    . fluticasone (FLONASE) 50 MCG/ACT nasal spray Place 2 sprays into both nostrils daily. (Patient not taking: Reported on 09/13/2020) 16 g 6  . ipratropium (ATROVENT) 0.03 % nasal spray PLACE 2 SPRAYS INTO BOTH NOSTRILS 3 TIMES DAILY AS NEEDED FOR RHINITIS (Patient not taking: Reported on 09/13/2020) 30 mL 0   No current  facility-administered medications on file prior to visit.    BP 140/76 (BP Location: Left Arm, Patient Position: Sitting, Cuff Size: Normal)   Pulse (!) 56   Temp 97.8 F (36.6 C) (Oral)   Resp 18   Ht 5\' 1"  (1.549 m)   Wt 149 lb 6.4 oz (67.8 kg)   SpO2 99%   BMI 28.23 kg/m       Objective:   Physical Exam  Physical Exam  Constitutional: She is oriented to person, place, and time. She appears well-developed and well-nourished. No distress.  HENT:  Head: Normocephalic and atraumatic.  Right Ear: Tympanic membrane and ear canal normal.  Left Ear: Tympanic membrane and ear canal normal.  Mouth/Throat: Not examined Eyes: Pupils are equal, round, and reactive to light. No scleral icterus.  Neck: Normal range of motion. No thyromegaly present.  Cardiovascular: Normal rate and regular rhythm.   No murmur heard. Pulmonary/Chest: Effort normal and breath sounds normal. No respiratory distress. He has no wheezes. She has no rales. She exhibits no tenderness.  Abdominal: Soft. Bowel sounds are normal. She exhibits no distension and no mass. There is no tenderness. There is no rebound and no guarding.  Musculoskeletal: She exhibits no edema.  Lymphadenopathy:    She has no cervical adenopathy.  Neurological: She is alert and oriented to person, place, and time. She has normal patellar reflexes. She exhibits normal muscle tone. Coordination normal.  Skin: Skin is warm and dry.  Psychiatric: She has a normal mood and affect. Her behavior is normal. Judgment and thought content normal.  Breasts: Examined lying Right: Without masses, retractions, discharge or axillary adenopathy.  Left: Without masses, retractions, discharge or axillary adenopathy.  Pelvic: deferred          Assessment & Plan:   Preventative care- declines covid vaccine, flu shot up to date. Tdap today.  (has new grandbaby).  Colo up to date. S/p hysterectomy.  Post-menopausal syndrome- we discussed having her cut  the premarin in half and see how she does on this decreased dosing.   This visit occurred during the SARS-CoV-2 public health emergency.  Safety protocols were in place, including screening questions prior to the visit, additional usage of staff PPE, and extensive cleaning of exam room while observing appropriate contact time as indicated for disinfecting solutions.         Assessment & Plan:

## 2020-09-13 NOTE — Patient Instructions (Signed)
Please complete lab work prior to leaving.   

## 2020-10-08 ENCOUNTER — Other Ambulatory Visit (HOSPITAL_COMMUNITY): Payer: Self-pay

## 2020-10-12 ENCOUNTER — Other Ambulatory Visit (HOSPITAL_COMMUNITY): Payer: Self-pay

## 2020-11-04 ENCOUNTER — Other Ambulatory Visit (HOSPITAL_COMMUNITY): Payer: Self-pay

## 2020-12-07 ENCOUNTER — Other Ambulatory Visit (HOSPITAL_COMMUNITY): Payer: Self-pay

## 2020-12-08 ENCOUNTER — Other Ambulatory Visit (HOSPITAL_COMMUNITY): Payer: Self-pay

## 2021-01-04 ENCOUNTER — Encounter: Payer: Self-pay | Admitting: Emergency Medicine

## 2021-01-04 ENCOUNTER — Other Ambulatory Visit (HOSPITAL_COMMUNITY): Payer: Self-pay

## 2021-01-04 ENCOUNTER — Other Ambulatory Visit (HOSPITAL_BASED_OUTPATIENT_CLINIC_OR_DEPARTMENT_OTHER): Payer: Self-pay

## 2021-01-04 ENCOUNTER — Other Ambulatory Visit: Payer: Self-pay

## 2021-01-04 ENCOUNTER — Ambulatory Visit
Admission: EM | Admit: 2021-01-04 | Discharge: 2021-01-04 | Disposition: A | Discharge status: Home / Self Care | Payer: 59 | Attending: Emergency Medicine | Admitting: Emergency Medicine

## 2021-01-04 DIAGNOSIS — M5441 Lumbago with sciatica, right side: Secondary | ICD-10-CM

## 2021-01-04 MED ORDER — TIZANIDINE HCL 2 MG PO TABS
2.0000 mg | ORAL_TABLET | Freq: Four times a day (QID) | ORAL | 0 refills | Status: DC | PRN
  Filled 2021-01-04: qty 30, 4d supply, fill #0

## 2021-01-04 MED ORDER — PREDNISONE 10 MG PO TABS
ORAL_TABLET | ORAL | 0 refills | Status: DC
  Filled 2021-01-04: qty 21, 6d supply, fill #0

## 2021-01-04 MED ORDER — KETOROLAC TROMETHAMINE 30 MG/ML IJ SOLN
30.0000 mg | Freq: Once | INTRAMUSCULAR | Status: AC
  Administered 2021-01-04: 30 mg via INTRAMUSCULAR

## 2021-01-04 NOTE — Discharge Instructions (Addendum)
We gave you a shot of toradol Begin prednisone taper x6 days-begin with 6 tablets on day 1, decrease by 1 tablet each day until complete-6, 5, 4, 3, 2, 1-take with food and earlier in the day if possible Supplement tizanidine-this is a muscle x-ray, use at home or bedtime, will cause drowsiness Alternate ice and heat, gentle stretching Follow-up if not improving or worsening

## 2021-01-04 NOTE — ED Triage Notes (Signed)
Patient c/o bilateral lower back pain x 4 days.   Patient denies fall or trauma.   Patient denies dysuria.   Patient endorses movement makes pain worst.   Patient endorses pain radiates down RT leg.   History of previous back problem, " I bend over to pick up a piece of paper and boom".   Patient took Aleve with some relief of symptoms.

## 2021-01-04 NOTE — ED Provider Notes (Signed)
[facility]    CSN: [id number] Arrival date & time: 01/04/21  1324      History   Chief Complaint Chief Complaint  Patient presents with   Back Pain    HPI Erica Diaz is a 60 y.o. female history of migraines presenting today for evaluation of back pain.  Reports 4 to 5 days of pain in lower back.  Radiating into right leg.  Denies any injury or trauma.  Denies any increase in activity or heavy lifting.  Denies any urinary symptoms, denies issues controlling urination or bowels.  Worse with movement.  Using Aleve without full relief.  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 Has right ovary   CESAREAN SECTION  1994   ear surgery  2000   repair of R TM   WISDOM TOOTH EXTRACTION  1998    OB History   No obstetric history on file.      Home Medications    Prior to Admission medications   Medication Sig Start Date End Date Taking? Authorizing Provider  estrogens, conjugated, (PREMARIN) 0.3 MG tablet TAKE 1 TABLET BY MOUTH ONCE DAILY 09/06/20 09/06/21 Yes [name], NP  predniSONE (DELTASONE) 10 MG tablet Begin with 6 tabs on day 1, 5 tab on day 2, 4 tab on day 3, 3 tab on day 4, 2 tab on day 5, 1 tab on day 6-take with food 01/04/21  Yes Isreal Moline C, PA-C  tiZANidine (ZANAFLEX) 2 MG tablet Take 1-2 tablets (2-4 mg total) by mouth every 6 (six) hours as needed for muscle spasms. 01/04/21  Yes Zamaya Rapaport C, PA-C  Calcium Carbonate-Vit D-Min (CALCIUM 1200 PO) Take by mouth.    [provider]  COLLAGEN PO Take 1 capsule by mouth daily.    [provider]  levocetirizine (XYZAL) 5 MG tablet TAKE 1 TABLET BY MOUTH EVERY EVENING 06/21/20 06/21/21  Scot Jun, FNP  Probiotic Product (ALIGN PO) Take by mouth.    [provider]    Family History Family History  Problem Relation Age of Onset   Diabetes Mother    Heart disease Mother    Heart disease Father 36   Dementia Father    Diabetes Maternal Grandfather    Heart failure Brother    Throat cancer Brother    Peripheral vascular disease Brother        ?PVD-had partial amputation   COPD Brother    Drug abuse Brother    CVA Sister    Colon cancer Neg Hx     Social History Social History   Tobacco Use   Smoking status: Former    Years: 8.00    Types: Cigarettes    Quit date: 05/21/1990    Years since quitting: 30.6   Smokeless tobacco: Never  Vaping Use   Vaping Use: Never used  Substance Use Topics   Alcohol use: No    Alcohol/week: 0.0 standard drinks   Drug use: No     Allergies  Thorazine [chlorpromazine hcl], Codeine, and Sulfa drugs cross reactors   Review of Systems Review of Systems  Constitutional:  Negative for fatigue and fever.  HENT:  Negative for mouth sores.   Eyes:  Negative for visual disturbance.  Respiratory:  Negative for shortness of breath.   Cardiovascular:  Negative for chest pain.  Gastrointestinal:  Negative for abdominal pain, nausea and vomiting.  Genitourinary:  Negative for genital sores.  Musculoskeletal:  Positive for back pain and myalgias. Negative for arthralgias and joint swelling.  Skin:  Negative for color change, rash and wound.  Neurological:  Negative for dizziness, weakness, light-headedness and headaches.    Physical Exam Triage Vital Signs ED Triage Vitals  Enc Vitals Group     BP      Pulse      Resp      Temp      Temp src      SpO2      Weight      Height      Head Circumference      Peak Flow      Pain Score       Pain Loc      Pain Edu?      Excl. in [place]?    No data found.  Updated Vital Signs BP 123/78 (BP Location: Left Arm)   Pulse 83   Temp 97.9 F (36.6 C) (Oral)   Resp 16   SpO2 97%   Visual Acuity Right Eye Distance:   Left Eye Distance:   Bilateral Distance:    Right Eye Near:   Left Eye Near:    Bilateral Near:     Physical Exam Vitals and nursing note reviewed.  Constitutional:      Appearance: She is well-developed.     Comments: No acute distress  HENT:     Head: Normocephalic and atraumatic.     Nose: Nose normal.  Eyes:     Conjunctiva/sclera: Conjunctivae normal.  Cardiovascular:     Rate and Rhythm: Normal rate.  Pulmonary:     Effort: Pulmonary effort is normal. No respiratory distress.  Abdominal:     General: There is no distension.  Musculoskeletal:        General: Normal range of motion.     Cervical back: Neck supple.     Comments: Tender to palpation of mid lumbar spine midline and lower lumbar spine midline, no palpable deformity or step-off, no overlying erythema or swelling, tenderness increasing into right lateral lumbar musculature  Strength at hips and knees 5/5 ankle bilaterally, pain elicited with resisted hip flexion as well as knee flexion  Skin:    General: Skin is warm and dry.  Neurological:     Mental Status: She is alert and oriented to person, place, and time.     UC Treatments / Results  Labs (all labs ordered are listed, but only abnormal results are displayed) Labs Reviewed - No data to display  EKG   Radiology No results found.  Procedures Procedures (including critical care time)  Medications Ordered in UC Medications  ketorolac (TORADOL) 30 MG/ML injection 30 mg (30 mg Intramuscular Given 01/04/21 1405)    Initial Impression / Assessment and Plan / UC Course  I have reviewed the triage vital signs and the nursing notes.  Pertinent labs & imaging results that were available during my care of the patient were  reviewed by me and considered in my medical decision making (see chart for details).  Low back pain-Toradol prior to discharge, prednisone taper given radicular distribution and supplement tizanidine.  Discussed strict return precautions. Patient verbalized understanding and is agreeable with plan.  Final Clinical Impressions(s) / UC Diagnoses   Final diagnoses:  Acute bilateral low back pain with right-sided sciatica     Discharge Instructions      We gave you a shot of toradol Begin prednisone taper x6 days-begin with 6 tablets on day 1, decrease by 1 tablet each day until complete-6, 5, 4, 3, 2, 1-take with food and earlier in the day if possible Supplement tizanidine-this is a muscle x-ray, use at home or bedtime, will cause drowsiness Alternate ice and heat, gentle stretching Follow-up if not improving or worsening      ED Prescriptions     Medication Sig Dispense Auth. Provider   predniSONE (DELTASONE) 10 MG tablet Begin with 6 tabs on day 1, 5 tab on day 2, 4 tab on day 3, 3 tab on day 4, 2 tab on day 5, 1 tab on day 6-take with food 21 tablet Mattheo Swindle C, PA-C   tiZANidine (ZANAFLEX) 2 MG tablet Take 1-2 tablets (2-4 mg total) by mouth every 6 (six) hours as needed for muscle spasms. 30 tablet Andron Marrazzo, Zihlman C, PA-C      PDMP not reviewed this encounter.   Janith L[place], [place] 01/04/21 1417

## 2021-02-06 ENCOUNTER — Other Ambulatory Visit (HOSPITAL_COMMUNITY): Payer: Self-pay

## 2021-02-14 ENCOUNTER — Ambulatory Visit: Payer: 59 | Admitting: Family

## 2021-02-14 ENCOUNTER — Other Ambulatory Visit (HOSPITAL_BASED_OUTPATIENT_CLINIC_OR_DEPARTMENT_OTHER): Payer: Self-pay

## 2021-02-14 ENCOUNTER — Other Ambulatory Visit: Payer: Self-pay

## 2021-02-14 DIAGNOSIS — R21 Rash and other nonspecific skin eruption: Secondary | ICD-10-CM | POA: Insufficient documentation

## 2021-02-14 MED ORDER — CLOTRIMAZOLE-BETAMETHASONE 1-0.05 % EX CREA
1.0000 "application " | TOPICAL_CREAM | Freq: Two times a day (BID) | CUTANEOUS | 0 refills | Status: DC
  Filled 2021-02-14: qty 45, 23d supply, fill #0

## 2021-02-14 MED ORDER — LEVOCETIRIZINE DIHYDROCHLORIDE 5 MG PO TABS
ORAL_TABLET | Freq: Every evening | ORAL | 1 refills | Status: DC
  Filled 2021-02-14: qty 90, 90d supply, fill #0
  Filled 2021-11-17: qty 90, 90d supply, fill #1

## 2021-02-14 MED ORDER — FLUCONAZOLE 150 MG PO TABS
150.0000 mg | ORAL_TABLET | Freq: Every day | ORAL | 0 refills | Status: DC
  Filled 2021-02-14: qty 1, 1d supply, fill #0

## 2021-02-14 NOTE — Patient Instructions (Signed)
Please begin lotrisone cream twice daily, diflucan tab once, and xyzal once daily. Call if symptoms worsen or if not improved in 1-2 weeks.

## 2021-02-14 NOTE — Progress Notes (Addendum)
Subjective:   By signing my name below, I, Erica Diaz, attest that this documentation has been prepared under the direction and in the presence of Erica Alar, NP, 02/14/2021   Patient ID: Erica Diaz, female    DOB: 25-Feb-1961, 60 y.o.   MRN: [id number]  Chief Complaint  Patient presents with   Eczema    Complains of eczema flare up, around waist     HPI Patient is in today for an office visit.   Rash: She is complaining of a rash on her hips and right knee that began 3 weeks ago after she went into a friends mineral pool. She notes that it is very itchy and has not experienced a rash like this before. She believes it appeared because of her visit to the pool or that it is eczema. Has been using betamethasone cream without improvement in her symptoms. Medications: She has run out of 5 mg xyzal and would like to have that refilled today to help with the itching.  Immunizations: She has received her flu vaccination through her work on 02/11/2021.   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 Has right ovary   CESAREAN SECTION  1994   ear surgery  2000   repair of R TM   WISDOM TOOTH EXTRACTION  1998    Family History  Problem Relation Age of Onset   Diabetes Mother    Heart disease Mother    Heart disease Father 82   Dementia Father    Diabetes Maternal Grandfather    Heart failure Brother    Throat cancer Brother    Peripheral vascular disease Brother        ?PVD-had partial amputation   COPD Brother    Drug abuse Brother    CVA Sister    Colon cancer Neg Hx     Social History   Socioeconomic History    Marital status: Married    Spouse name: Not on file   Number of children: 1   Years of education: Not on file   Highest education level: Not on file  Occupational History    Employer: [employer]  Tobacco Use   Smoking status: Former    Years: 8.00    Types: Cigarettes    Quit date: 05/21/1990    Years since quitting: 30.7   Smokeless tobacco: Never  Vaping Use   Vaping Use: Never used  Substance and Sexual Activity   Alcohol use: No    Alcohol/week: 0.0 standard drinks   Drug use: No   Sexual activity: Not on file  Other Topics Concern   Not on file  Social History Narrative   Caffeine:  1 cup coffee daily   Regular exercise:  Active work life. (walks a lot on job)   1 biological child, 2 step children   Works in Water engineer at [employer]   Married,    One daughter, 2 step daughters.                Social Determinants of Radio broadcast assistant  Strain: Not on file  Food Insecurity: Not on file  Transportation Needs: Not on file  Physical Activity: Not on file  Stress: Not on file  Social Connections: Not on file  Intimate Partner Violence: Not on file    Outpatient Medications Prior to Visit  Medication Sig Dispense Refill   Calcium Carbonate-Vit D-Min (CALCIUM 1200 PO) Take by mouth.     COLLAGEN PO Take 1 capsule by mouth daily.     estrogens, conjugated, (PREMARIN) 0.3 MG tablet TAKE 1 TABLET BY MOUTH ONCE DAILY 30 tablet 5   Probiotic Product (ALIGN PO) Take by mouth.     tiZANidine (ZANAFLEX) 2 MG tablet Take 1-2 tablets (2-4 mg total) by mouth every 6 (six) hours as needed for muscle spasms. 30 tablet 0   levocetirizine (XYZAL) 5 MG tablet TAKE 1 TABLET BY MOUTH EVERY EVENING 30 tablet 0   predniSONE (DELTASONE) 10 MG tablet Begin with 6 tabs on day 1, 5 tab on day 2, 4 tab on day 3, 3 tab on day 4, 2 tab on day 5, 1 tab on day 6-take with food 21 tablet 0   No facility-administered medications prior to visit.    Allergies  Allergen  Reactions   Thorazine [Chlorpromazine Hcl] Other (See Comments)    Neurologic disturbance   Codeine Itching   Sulfa Drugs Cross Reactors Hives    Review of Systems  Skin:  Positive for itching (Itching at site of rash) and rash (Rash on lower back and right knee).      Objective:    Physical Exam Constitutional:      General: She is not in acute distress.    Appearance: Normal appearance. She is not ill-appearing.  HENT:     Head: Normocephalic and atraumatic.     Right Ear: External ear normal.     Left Ear: External ear normal.  Eyes:     Extraocular Movements: Extraocular movements intact.     Pupils: Pupils are equal, round, and reactive to light.  Cardiovascular:     Rate and Rhythm: Normal rate and regular rhythm.     Heart sounds: Normal heart sounds. No murmur heard.   No gallop.  Pulmonary:     Effort: Pulmonary effort is normal. No respiratory distress.     Breath sounds: Normal breath sounds. No wheezing or rales.  Lymphadenopathy:     Cervical: No cervical adenopathy.  Skin:    General: Skin is warm and dry.     Findings: Rash present. Erythema: mild rash on right knee.    Comments: (+) annular rash on lower back  Neurological:     Mental Status: She is alert and oriented to person, place, and time.  Psychiatric:        Behavior: Behavior normal.        Judgment: Judgment normal.    BP (!) 138/55 (BP Location: Right Arm, Patient Position: Sitting, Cuff Size: Small)   Pulse (!) 58   Temp 97.8 F (36.6 C) (Oral)   Resp 16   Ht 5\' 1"  (1.549 m)   Wt 144 lb 3.2 oz (65.4 kg)   SpO2 100%   BMI 27.25 kg/m  Wt Readings from Last 3 Encounters:  02/14/21 144 lb 3.2 oz (65.4 kg)  09/13/20 149 lb 6.4 oz (67.8 kg)  06/21/20 149 lb (67.6 kg)       Assessment & Plan:   Problem List Items Addressed This Visit       Unprioritized  Skin rash    Rash appears fungal in nature. Restart xyzal for itching, start lotrisone cream bid and diflucan 150mg  PO x 1.  She is advised to call if symptoms worsen or if not improved in 1-2 weeks.       Meds ordered this encounter  Medications   clotrimazole-betamethasone (LOTRISONE) cream    Sig: Apply 1 application topically 2 (two) times daily.    Dispense:  45 g    Refill:  0    Order Specific Question:   Supervising Provider    Answer:   Penni Homans A [[id number]]   levocetirizine (XYZAL) 5 MG tablet    Sig: TAKE 1 TABLET BY MOUTH EVERY EVENING    Dispense:  90 tablet    Refill:  1    Order Specific Question:   Supervising Provider    Answer:   Penni Homans A [[id number]]   fluconazole (DIFLUCAN) 150 MG tablet    Sig: Take 1 tablet (150 mg total) by mouth daily.    Dispense:  1 tablet    Refill:  0    Order Specific Question:   Supervising Provider    Answer:   Penni Homans A [[id number]]    I, Erica Alar, NP, personally preformed the services described in this documentation.  All medical record entries made by the scribe were at my direction and in my presence.  I have reviewed the chart and discharge instructions (if applicable) and agree that the record reflects my personal performance and is accurate and complete. 02/14/2021  I,Erica Diaz,acting as a scribe for Nance Pear, NP.,have documented all relevant documentation on the behalf of Nance Pear, NP,as directed by  Nance Pear, NP while in the presence of Nance Pear, NP.  Nance Pear, NP

## 2021-02-14 NOTE — Assessment & Plan Note (Signed)
Rash appears fungal in nature. Restart xyzal for itching, start lotrisone cream bid and diflucan 150mg  PO x 1. She is advised to call if symptoms worsen or if not improved in 1-2 weeks.

## 2021-03-07 ENCOUNTER — Other Ambulatory Visit (HOSPITAL_COMMUNITY): Payer: Self-pay

## 2021-03-07 ENCOUNTER — Other Ambulatory Visit: Payer: Self-pay | Admitting: Family

## 2021-03-07 MED ORDER — ESTROGENS CONJUGATED 0.3 MG PO TABS
ORAL_TABLET | Freq: Every day | ORAL | 5 refills | Status: DC
  Filled 2021-03-07: qty 30, 30d supply, fill #0
  Filled 2021-04-04: qty 30, 30d supply, fill #1
  Filled 2021-05-04: qty 30, 30d supply, fill #2
  Filled 2021-06-05: qty 30, 30d supply, fill #3
  Filled 2021-07-06: qty 30, 30d supply, fill #4
  Filled 2021-08-07: qty 30, 30d supply, fill #5

## 2021-04-05 ENCOUNTER — Other Ambulatory Visit (HOSPITAL_COMMUNITY): Payer: Self-pay

## 2021-05-04 ENCOUNTER — Other Ambulatory Visit (HOSPITAL_COMMUNITY): Payer: Self-pay

## 2021-05-05 ENCOUNTER — Other Ambulatory Visit (HOSPITAL_COMMUNITY): Payer: Self-pay

## 2021-05-11 ENCOUNTER — Other Ambulatory Visit (HOSPITAL_COMMUNITY): Payer: Self-pay

## 2021-06-05 ENCOUNTER — Other Ambulatory Visit (HOSPITAL_COMMUNITY): Payer: Self-pay

## 2021-06-19 ENCOUNTER — Telehealth: Payer: Self-pay | Admitting: Family

## 2021-06-20 ENCOUNTER — Ambulatory Visit: Payer: 59 | Admitting: Family Medicine

## 2021-06-20 ENCOUNTER — Encounter: Payer: Self-pay | Admitting: Family Medicine

## 2021-06-20 ENCOUNTER — Other Ambulatory Visit (HOSPITAL_BASED_OUTPATIENT_CLINIC_OR_DEPARTMENT_OTHER): Payer: Self-pay

## 2021-06-20 VITALS — BP 110/51 | HR 80 | Temp 97.7°F | Ht 61.0 in | Wt 144.6 lb

## 2021-06-20 DIAGNOSIS — J0140 Acute pansinusitis, unspecified: Secondary | ICD-10-CM | POA: Diagnosis not present

## 2021-06-20 MED ORDER — AMOXICILLIN-POT CLAVULANATE 875-125 MG PO TABS
1.0000 | ORAL_TABLET | Freq: Two times a day (BID) | ORAL | 0 refills | Status: AC
  Filled 2021-06-20: qty 20, 10d supply, fill #0

## 2021-06-20 MED ORDER — FLUCONAZOLE 150 MG PO TABS
150.0000 mg | ORAL_TABLET | Freq: Once | ORAL | 0 refills | Status: AC
  Filled 2021-06-20: qty 2, 2d supply, fill #0

## 2021-06-20 NOTE — Progress Notes (Signed)
Acute Office Visit  Subjective:    Patient ID: Erica Diaz, female    DOB: Jan 03, 1961, 62 y.o.   MRN: [id number]  Chief Complaint  Patient presents with   sinus pressure    Symptoms 3 weeks    HPI Patient is in today for sinusitis.   Patient reports 3 weeks of having URI symptoms: scratchy throat, ear pressure, headaches, sinus pressure, chills, occasional cough. She denies any chest pain, dyspnea, wheezing, fevers, nausea, vomiting, diarrhea. She has been taking Vitamin C and alka seltzer with minimal improvement.      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 Has right ovary   CESAREAN SECTION  1994   ear surgery  2000   repair of R TM   WISDOM TOOTH EXTRACTION  1998    Family History  Problem Relation Age of Onset   Diabetes Mother    Heart disease Mother    Heart disease Father 37   Dementia Father    Diabetes Maternal Grandfather    Heart failure Brother    Throat cancer Brother    Peripheral vascular disease Brother        ?PVD-had partial amputation   COPD Brother    Drug abuse Brother    CVA Sister    Colon cancer Neg Hx     Social History   Socioeconomic History   Marital status: Married    Spouse name: Not on file   Number of children: 1   Years of education: Not on file   Highest education level: Not on file  Occupational History    Employer: [employer]  Tobacco Use   Smoking status: Former    Years: 8.00    Types: Cigarettes    Quit date: 05/21/1990    Years since quitting: 31.1   Smokeless tobacco: Never  Vaping Use   Vaping Use: Never used  Substance and Sexual Activity   Alcohol use: No    Alcohol/week: 0.0 standard drinks   Drug use: No   Sexual activity: Not on file  Other Topics Concern   Not on file  Social History  Narrative   Caffeine:  1 cup coffee daily   Regular exercise:  Active work life. (walks a lot on job)   1 biological child, 2 step children   Works in Water engineer at [employer]   Married,    One daughter, 2 step daughters.                Social Determinants of Health   Financial Resource Strain: Not on file  Food Insecurity: Not on file  Transportation Needs: Not on file  Physical Activity: Not on file  Stress: Not on file  Social Connections: Not on file  Intimate Partner Violence: Not on file    Outpatient Medications Prior to Visit  Medication Sig Dispense Refill   Calcium Carbonate-Vit D-Min (CALCIUM 1200 PO) Take by mouth.     COLLAGEN PO Take 1 capsule by mouth daily.     estrogens, conjugated, (PREMARIN) 0.3 MG tablet TAKE 1 TABLET BY MOUTH ONCE DAILY 30 tablet 5   fluconazole (DIFLUCAN) 150 MG tablet Take 1 tablet (150 mg total) by mouth daily. 1 tablet 0   levocetirizine (XYZAL) 5  MG tablet TAKE 1 TABLET BY MOUTH EVERY EVENING 90 tablet 1   Probiotic Product (ALIGN PO) Take by mouth.     clotrimazole-betamethasone (LOTRISONE) cream Apply 1 application topically 2 (two) times daily. 45 g 0   tiZANidine (ZANAFLEX) 2 MG tablet Take 1-2 tablets (2-4 mg total) by mouth every 6 (six) hours as needed for muscle spasms. 30 tablet 0   No facility-administered medications prior to visit.    Allergies  Allergen Reactions   Thorazine [Chlorpromazine Hcl] Other (See Comments)    Neurologic disturbance   Codeine Itching   Sulfa Drugs Cross Reactors Hives    Review of Systems All review of systems negative except what is listed in the HPI     Objective:    Physical Exam Vitals reviewed.  Constitutional:      Appearance: Normal appearance.  HENT:     Head: Normocephalic and atraumatic.     Right Ear: A middle ear effusion is present.     Mouth/Throat:     Mouth: Mucous membranes are moist.     Pharynx: Oropharynx is clear.  Eyes:     Conjunctiva/sclera:  Conjunctivae normal.  Cardiovascular:     Rate and Rhythm: Normal rate and regular rhythm.  Pulmonary:     Effort: Pulmonary effort is normal.     Breath sounds: Normal breath sounds.  Musculoskeletal:     Cervical back: Normal range of motion and neck supple.  Skin:    General: Skin is warm and dry.  Neurological:     General: No focal deficit present.     Mental Status: She is alert and oriented to person, place, and time. Mental status is at baseline.  Psychiatric:        Mood and Affect: Mood normal.        Behavior: Behavior normal.        Thought Content: Thought content normal.        Judgment: Judgment normal.    BP (!) 110/51    Pulse 80    Temp 97.7 F (36.5 C)    Ht 5\' 1"  (1.549 m)    Wt 144 lb 9.6 oz (65.6 kg)    SpO2 99%    BMI 27.32 kg/m  Wt Readings from Last 3 Encounters:  06/20/21 144 lb 9.6 oz (65.6 kg)  02/14/21 144 lb 3.2 oz (65.4 kg)  09/13/20 149 lb 6.4 oz (67.8 kg)    Health Maintenance Due  Topic Date Due   HIV Screening  Never done   Hepatitis C Screening  Never done   PAP SMEAR-Modifier  04/21/2016    There are no preventive care reminders to display for this patient.   Lab Results  Component Value Date   TSH 2.45 09/13/2020   Lab Results  Component Value Date   WBC 4.4 09/04/2019   HGB 13.4 09/04/2019   HCT 39.4 09/04/2019   MCV 88.3 09/04/2019   PLT 282.0 09/04/2019   Lab Results  Component Value Date   NA 140 09/13/2020   K 4.2 09/13/2020   CO2 26 09/13/2020   GLUCOSE 86 09/13/2020   BUN 15 09/13/2020   CREATININE 0.74 09/13/2020   BILITOT 0.6 09/13/2020   ALKPHOS 67 09/13/2020   AST 15 09/13/2020   ALT 12 09/13/2020   PROT 6.8 09/13/2020   ALBUMIN 4.1 09/13/2020   CALCIUM 9.5 09/13/2020   GFR 88.05 09/13/2020   Lab Results  Component Value Date   CHOL 193 09/13/2020  Lab Results  Component Value Date   HDL 72.30 09/13/2020   Lab Results  Component Value Date   LDLCALC 106 (H) 09/13/2020   Lab Results   Component Value Date   TRIG 72.0 09/13/2020   Lab Results  Component Value Date   CHOLHDL 3 09/13/2020   No results found for: HGBA1C     Assessment & Plan:   1. Acute non-recurrent pansinusitis Start Augmentin. Recommend OTC Mucinex and flonase. Continue supportive measures including rest, hydration, humidifier use, steam showers, warm compresses to sinuses, warm liquids with lemon and honey, and over-the-counter cough, cold, and analgesics as needed.  Adding diflucan since she is prone to yeast infections when on ABX. Patient aware of signs/symptoms requiring further/urgent evaluation.    - amoxicillin-clavulanate (AUGMENTIN) 875-125 MG tablet; Take 1 tablet by mouth 2 (two) times daily for 10 days.  Dispense: 20 tablet; Refill: 0 - fluconazole (DIFLUCAN) 150 MG tablet; Take 1 tablet (150 mg total) by mouth once for 1 dose.  Dispense: 2 tablet; Refill: 0   Follow-up if symptoms worsen or fail to improve.   Terrilyn Saver, NP

## 2021-06-21 NOTE — Telephone Encounter (Signed)
Opened in error

## 2021-06-27 ENCOUNTER — Other Ambulatory Visit (HOSPITAL_BASED_OUTPATIENT_CLINIC_OR_DEPARTMENT_OTHER): Payer: Self-pay

## 2021-06-27 ENCOUNTER — Other Ambulatory Visit: Payer: Self-pay

## 2021-06-27 ENCOUNTER — Ambulatory Visit: Payer: 59 | Admitting: Family

## 2021-06-27 ENCOUNTER — Ambulatory Visit (HOSPITAL_BASED_OUTPATIENT_CLINIC_OR_DEPARTMENT_OTHER)
Admission: RE | Admit: 2021-06-27 | Discharge: 2021-06-27 | Disposition: A | Discharge status: Home / Self Care | Payer: 59 | Source: Ambulatory Visit | Attending: Family | Admitting: Family

## 2021-06-27 VITALS — BP 136/65 | HR 83 | Temp 98.5°F | Resp 12 | Ht 61.0 in | Wt 144.2 lb

## 2021-06-27 DIAGNOSIS — J40 Bronchitis, not specified as acute or chronic: Secondary | ICD-10-CM

## 2021-06-27 DIAGNOSIS — R059 Cough, unspecified: Secondary | ICD-10-CM | POA: Diagnosis not present

## 2021-06-27 MED ORDER — PREDNISONE 10 MG PO TABS
ORAL_TABLET | ORAL | 0 refills | Status: DC
  Filled 2021-06-27: qty 20, 8d supply, fill #0

## 2021-06-27 MED ORDER — GUAIFENESIN-CODEINE 100-10 MG/5ML PO SOLN
5.0000 mL | Freq: Three times a day (TID) | ORAL | 0 refills | Status: DC | PRN
  Filled 2021-06-27: qty 75, 5d supply, fill #0

## 2021-06-27 NOTE — Progress Notes (Signed)
Subjective:     Patient ID: Erica Diaz, female    DOB: 1961-03-18, 61 y.o.   MRN: [id number]  Chief Complaint  Patient presents with   Cough    Deeper from last visit 06/20/21     feels like fluid in both ears    Cough  Patient was seen one week ago by another provider for sinusitis and was prescribed augmentin. Since that time, she reports that her cough is worse, feels "more bronchitis" like. Cough is keeping her up at night. Notes overall she "doesn't feel good." She feels like she has some fluid in her right ear.  Nasal congestion is draining.  She did not test for covid.  Other family members are also ill.    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 Has right ovary   CESAREAN SECTION  1994   ear surgery  2000   repair of R TM   WISDOM TOOTH EXTRACTION  1998    Family History  Problem Relation Age of Onset   Diabetes Mother    Heart disease Mother    Heart disease Father 21   Dementia Father    Diabetes Maternal Grandfather    Heart failure Brother    Throat cancer Brother    Peripheral vascular disease Brother        ?PVD-had partial amputation   COPD Brother    Drug abuse Brother    CVA Sister    Colon cancer Neg Hx     Social History   Socioeconomic History   Marital status: Married    Spouse name: Not on file   Number of children: 1   Years of education: Not on file   Highest education level: Not on file  Occupational History    Employer: [employer]  Tobacco Use   Smoking status: Former    Years: 8.00    Types: Cigarettes    Quit date: 05/21/1990    Years since quitting: 31.1   Smokeless  tobacco: Never  Vaping Use   Vaping Use: Never used  Substance and Sexual Activity   Alcohol use: No    Alcohol/week: 0.0 standard drinks   Drug use: No   Sexual activity: Not on file  Other Topics Concern   Not on file  Social History Narrative   Caffeine:  1 cup coffee daily   Regular exercise:  Active work life. (walks a lot on job)   1 biological child, 2 step children   Works in Water engineer at [employer]   Married,    One daughter, 2 step daughters.                Social Determinants of Health   Financial Resource Strain: Not on file  Food Insecurity: Not on file  Transportation Needs: Not on file  Physical Activity: Not on file  Stress: Not on file  Social Connections: Not on file  Intimate Partner Violence: Not on file    Outpatient Medications Prior to Visit  Medication Sig Dispense Refill   amoxicillin-clavulanate (AUGMENTIN) 875-125 MG  tablet Take 1 tablet by mouth 2 (two) times daily for 10 days. 20 tablet 0   Calcium Carbonate-Vit D-Min (CALCIUM 1200 PO) Take by mouth.     COLLAGEN PO Take 1 capsule by mouth daily.     estrogens, conjugated, (PREMARIN) 0.3 MG tablet TAKE 1 TABLET BY MOUTH ONCE DAILY 30 tablet 5   levocetirizine (XYZAL) 5 MG tablet TAKE 1 TABLET BY MOUTH EVERY EVENING 90 tablet 1   Probiotic Product (ALIGN PO) Take by mouth.     No facility-administered medications prior to visit.    Allergies  Allergen Reactions   Thorazine [Chlorpromazine Hcl] Other (See Comments)    Neurologic disturbance   Codeine Itching   Sulfa Drugs Cross Reactors Hives    Review of Systems  Respiratory:  Positive for cough.   See HPI    Objective:    Physical Exam Constitutional:      General: She is not in acute distress.    Appearance: Normal appearance. She is well-developed.  HENT:     Head: Normocephalic and atraumatic.     Right Ear: Tympanic membrane, ear canal and external ear normal.     Left Ear: Tympanic membrane, ear canal and  external ear normal.  Eyes:     General: No scleral icterus. Neck:     Thyroid: No thyromegaly.  Cardiovascular:     Rate and Rhythm: Normal rate and regular rhythm.     Heart sounds: Normal heart sounds. No murmur heard. Pulmonary:     Effort: Pulmonary effort is normal. No respiratory distress.     Breath sounds: Normal breath sounds. No wheezing.  Musculoskeletal:     Cervical back: Neck supple.  Skin:    General: Skin is warm and dry.  Neurological:     Mental Status: She is alert and oriented to person, place, and time.  Psychiatric:        Mood and Affect: Mood normal.        Behavior: Behavior normal.        Thought Content: Thought content normal.        Judgment: Judgment normal.    BP 136/65 (BP Location: Right Arm, Cuff Size: Normal)    Pulse 83    Temp 98.5 F (36.9 C) (Oral)    Resp 12    Ht 5\' 1"  (1.549 m)    Wt 144 lb 3.2 oz (65.4 kg)    SpO2 100%    BMI 27.25 kg/m  Wt Readings from Last 3 Encounters:  06/27/21 144 lb 3.2 oz (65.4 kg)  06/20/21 144 lb 9.6 oz (65.6 kg)  02/14/21 144 lb 3.2 oz (65.4 kg)       Assessment & Plan:   Problem List Items Addressed This Visit       Unprioritized   Bronchitis - Primary    New. Will rx with prednisone taper, cheratussin (she states she has tolerated in the past without itching). Continue Augmentin. Will obtain CXR to rule out pneumonia.       Relevant Orders   DG Chest 2 View    I am having Erica Diaz start on Cheratussin AC and predniSONE. I am also having her maintain her COLLAGEN PO, Calcium Carbonate-Vit D-Min (CALCIUM 1200 PO), Probiotic Product (ALIGN PO), levocetirizine, estrogens (conjugated), and amoxicillin-clavulanate.  Meds ordered this encounter  Medications   guaiFENesin-codeine (CHERATUSSIN AC) 100-10 MG/5ML syrup    Sig: Take 5 mLs by mouth 3 (three) times daily as needed for cough.  Dispense:  75 mL    Refill:  0    Order Specific Question:   Supervising Provider    Answer:    Penni Homans A [[id number]]   predniSONE (DELTASONE) 10 MG tablet    Sig: 4 tabs by mouth once daily for 2 days, then 3 tabs daily x 2 days, then 2 tabs daily x 2 days, then 1 tab daily x 2 days    Dispense:  20 tablet    Refill:  0    Order Specific Question:   Supervising Provider    Answer:   Penni Homans A [[id number]]

## 2021-06-27 NOTE — Patient Instructions (Signed)
Start prednisone taper. You can use cheratussin as needed for cough.  Complete chest x-ray on the first floor .

## 2021-06-27 NOTE — Assessment & Plan Note (Signed)
New. Will rx with prednisone taper, cheratussin (she states she has tolerated in the past without itching). Continue Augmentin. Will obtain CXR to rule out pneumonia.

## 2021-07-07 ENCOUNTER — Other Ambulatory Visit (HOSPITAL_COMMUNITY): Payer: Self-pay

## 2021-08-08 ENCOUNTER — Other Ambulatory Visit (HOSPITAL_COMMUNITY): Payer: Self-pay

## 2021-08-15 DIAGNOSIS — Z1231 Encounter for screening mammogram for malignant neoplasm of breast: Secondary | ICD-10-CM | POA: Diagnosis not present

## 2021-08-15 LAB — HM MAMMOGRAPHY

## 2021-08-18 ENCOUNTER — Encounter: Payer: Self-pay | Admitting: Family

## 2021-09-07 ENCOUNTER — Other Ambulatory Visit: Payer: Self-pay | Admitting: Family

## 2021-09-08 ENCOUNTER — Other Ambulatory Visit (HOSPITAL_COMMUNITY): Payer: Self-pay

## 2021-09-08 MED ORDER — ESTROGENS CONJUGATED 0.3 MG PO TABS
ORAL_TABLET | Freq: Every day | ORAL | 5 refills | Status: DC
  Filled 2021-09-08: qty 30, 30d supply, fill #0
  Filled 2021-10-11: qty 30, 30d supply, fill #1
  Filled 2021-11-06: qty 30, 30d supply, fill #2
  Filled 2021-12-12: qty 30, 30d supply, fill #3
  Filled 2022-01-11: qty 30, 30d supply, fill #4
  Filled 2022-02-08: qty 30, 30d supply, fill #5

## 2021-09-15 ENCOUNTER — Ambulatory Visit (INDEPENDENT_AMBULATORY_CARE_PROVIDER_SITE_OTHER): Payer: 59 | Admitting: Family

## 2021-09-15 ENCOUNTER — Other Ambulatory Visit (HOSPITAL_COMMUNITY): Payer: Self-pay

## 2021-09-15 VITALS — BP 127/62 | HR 71 | Temp 98.4°F | Resp 16 | Ht 61.0 in | Wt 139.0 lb

## 2021-09-15 DIAGNOSIS — E785 Hyperlipidemia, unspecified: Secondary | ICD-10-CM | POA: Diagnosis not present

## 2021-09-15 DIAGNOSIS — Z0001 Encounter for general adult medical examination with abnormal findings: Secondary | ICD-10-CM | POA: Diagnosis not present

## 2021-09-15 DIAGNOSIS — E0590 Thyrotoxicosis, unspecified without thyrotoxic crisis or storm: Secondary | ICD-10-CM | POA: Diagnosis not present

## 2021-09-15 DIAGNOSIS — Z0000 Encounter for general adult medical examination without abnormal findings: Secondary | ICD-10-CM

## 2021-09-15 DIAGNOSIS — R21 Rash and other nonspecific skin eruption: Secondary | ICD-10-CM

## 2021-09-15 DIAGNOSIS — M8580 Other specified disorders of bone density and structure, unspecified site: Secondary | ICD-10-CM

## 2021-09-15 LAB — COMPREHENSIVE METABOLIC PANEL
ALT: 12 U/L (ref 0–35)
AST: 16 U/L (ref 0–37)
Albumin: 4.4 g/dL (ref 3.5–5.2)
Alkaline Phosphatase: 77 U/L (ref 39–117)
BUN: 15 mg/dL (ref 6–23)
CO2: 25 mEq/L (ref 19–32)
Calcium: 9.1 mg/dL (ref 8.4–10.5)
Chloride: 105 mEq/L (ref 96–112)
Creatinine, Ser: 0.81 mg/dL (ref 0.40–1.20)
GFR: 78.44 mL/min (ref >60.00)
Glucose, Bld: 88 mg/dL (ref 70–99)
Potassium: 4.3 mEq/L (ref 3.5–5.1)
Sodium: 138 mEq/L (ref 135–145)
Total Bilirubin: 0.6 mg/dL (ref 0.2–1.2)
Total Protein: 7 g/dL (ref 6.0–8.3)

## 2021-09-15 LAB — LIPID PANEL
Cholesterol: 206 mg/dL — ABNORMAL HIGH (ref 0–200)
HDL: 82.7 mg/dL (ref >39.00)
LDL Cholesterol: 109 mg/dL — ABNORMAL HIGH (ref 0–99)
NonHDL: 123.08
Total CHOL/HDL Ratio: 2
Triglycerides: 68 mg/dL (ref 0.0–149.0)
VLDL: 13.6 mg/dL (ref 0.0–40.0)

## 2021-09-15 LAB — TSH: TSH: 1.17 u[IU]/mL (ref 0.35–5.50)

## 2021-09-15 MED ORDER — BETAMETHASONE DIPROPIONATE 0.05 % EX CREA
TOPICAL_CREAM | Freq: Two times a day (BID) | CUTANEOUS | 1 refills | Status: DC
  Filled 2021-09-15: qty 30, 30d supply, fill #0
  Filled 2022-02-13: qty 30, 30d supply, fill #1

## 2021-09-15 NOTE — Progress Notes (Signed)
? ?Subjective:  ? ? ? Patient ID: Erica Diaz, female    DOB: 27-Mar-1961, 61 y.o.   MRN: [id number] ? ?Chief Complaint  ?Patient presents with  ? Annual Exam  ? ? ?HPI ? ?Patient presents today for complete physical. ? ?Immunizations: shingrix x 2, declines covid vaccines ?Diet:  healthy ?Exercise: regular at work ?Wt Readings from Last 3 Encounters:  ?09/15/21 139 lb (63 kg)  ?06/27/21 144 lb 3.2 oz (65.4 kg)  ?06/20/21 144 lb 9.6 oz (65.6 kg)  ?Colonoscopy: 10/08/14  ?Dexa: 7/20 osteopenia, declines dexa this year  ?Pap Smear:  N/A s/p hysterectoy ?Mammogram: 08/15/21 ?Vision:  up to date ?Dental: up to date ? ? ?Health Maintenance Due  ?Topic Date Due  ? HIV Screening  Never done  ? Hepatitis C Screening  Never done  ? ? ?Past Medical History:  ?Diagnosis Date  ? Cancer [facility])   ? 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 Endometriosis   ? Hyperlipidemia   ? Migraines   ? ? ?Past Surgical History:  ?Procedure Laterality Date  ? ABDOMINAL HYSTERECTOMY  05/21/90  ? partial hysterectomy. Has right ovary  ? [place]  ? ear surgery  2000  ? repair of R TM  ? [place]  ? ? ?Family History  ?Problem Relation Age of Onset  ? Diabetes Mother   ? Heart disease Mother   ? Heart disease Father 2  ? Dementia Father   ? Diabetes Maternal Grandfather   ? Heart failure Brother   ? Throat cancer Brother   ? Peripheral vascular disease Brother   ?     ?PVD-had partial amputation  ? COPD Brother   ? Drug abuse Brother   ? CVA Sister   ? Colon cancer Neg Hx   ? ? ?Social History  ? ?Socioeconomic History  ? Marital status: Married  ?  Spouse name: Not on file  ? Number of children: 1  ? Years of education: Not on file  ? Highest education level: Not on file  ?Occupational History  ?  Employer: [employer]  ?Tobacco Use  ? Smoking status: Former  ?  Years: 8.00  ?  Types: Cigarettes  ?  Quit date: 05/21/1990  ?  Years since quitting: 31.3  ? Smokeless tobacco: Never  ?Vaping Use  ? Vaping Use: Never used   ?Substance and Sexual Activity  ? Alcohol use: No  ?  Alcohol/week: 0.0 standard drinks  ? Drug use: No  ? Sexual activity: Not on file  ?Other Topics Concern  ? Not on file  ?Social History Narrative  ? Caffeine:  1 cup coffee daily  ? Regular exercise:  Active work life. (walks a lot on job)  ? 1 biological child, 2 step children  ? Works in Water engineer at [employer]  ? Married,   ? One daughter, 2 step daughters.   ?   ?   ?   ?   ? ?Social Determinants of Health  ? ?Financial Resource Strain: Not on file  ?Food Insecurity: Not on file  ?Transportation Needs: Not on file  ?Physical Activity: Not on file  ?Stress: Not on file  ?Social Connections: Not on file  ?Intimate Partner Violence: Not on file  ? ? ?Outpatient Medications Prior to Visit  ?Medication Sig Dispense Refill  ? Calcium Carbonate-Vit D-Min (CALCIUM 1200 PO) Take by mouth.    ? COLLAGEN PO Take 1 capsule by mouth daily.    ?  estrogens, conjugated, (PREMARIN) 0.3 MG tablet TAKE 1 TABLET BY MOUTH ONCE DAILY 30 tablet 5  ? guaiFENesin-codeine 100-10 MG/5ML syrup Take 5 mLs by mouth 3 (three) times daily as needed for cough. 75 mL 0  ? levocetirizine (XYZAL) 5 MG tablet TAKE 1 TABLET BY MOUTH EVERY EVENING 90 tablet 1  ? Probiotic Product (ALIGN PO) Take by mouth.    ? predniSONE (DELTASONE) 10 MG tablet Take 4 tablets by mouth once daily for 2 days, then 3 tablets daily for 2 days, then 2 tablets daily for 2 days, then 1 tablet daily for 2 days. 20 tablet 0  ? ?No facility-administered medications prior to visit.  ? ? ?Allergies  ?Allergen Reactions  ? Thorazine [Chlorpromazine Hcl] Other (See Comments)  ?  Neurologic disturbance  ? Codeine Itching  ? Sulfa Drugs Cross Reactors Hives  ? ? ?Review of Systems  ?Constitutional:  Positive for weight loss (intentional).  ?HENT:  Negative for congestion.   ?Respiratory:  Negative for cough.   ?Skin:  Positive for rash (itching and redness overlying right knee).  ? ?   ?Objective:  ?  ?Physical  Exam ? ?BP 127/62 (BP Location: Right Arm, Patient Position: Sitting, Cuff Size: Small)   Pulse 71   Temp 98.4 ?F (36.9 ?C) (Oral)   Resp 16   Ht '5\' 1"'  (1.549 m)   Wt 139 lb (63 kg)   SpO2 100%   BMI 26.26 kg/m?  ?Wt Readings from Last 3 Encounters:  ?09/15/21 139 lb (63 kg)  ?06/27/21 144 lb 3.2 oz (65.4 kg)  ?06/20/21 144 lb 9.6 oz (65.6 kg)  ?Physical Exam  ?Constitutional: She is oriented to person, place, and time. She appears well-developed and well-nourished. No distress.  ?HENT:  ?Head: Normocephalic and atraumatic.  ?Right Ear: Tympanic membrane and ear canal normal.  ?Left Ear: Tympanic membrane and ear canal normal.  ?Mouth/Throat: normal.  ?Eyes: Pupils are equal, round, and reactive to light. No scleral icterus.  ?Neck: Normal range of motion. No thyromegaly present.  ?Cardiovascular: Normal rate and regular rhythm.   ?No murmur heard. ?Pulmonary/Chest: Effort normal and breath sounds normal. No respiratory distress. He has no wheezes. She has no rales. She exhibits no tenderness.  ?Abdominal: Soft. Bowel sounds are normal. She exhibits no distension and no mass. There is no tenderness. There is no rebound and no guarding.  ?Musculoskeletal: She exhibits no edema.  ?Lymphadenopathy:  ?  She has no cervical adenopathy.  ?Neurological: She is alert and oriented to person, place, and time. She has normal patellar reflexes. She exhibits normal muscle tone. Coordination normal.  ?Skin: Skin is warm and dry. Mild erythema overlying right knee cap.  ?Psychiatric: She has a normal mood and affect. Her behavior is normal. Judgment and thought content normal.  ?Breast/pelvic: deferred  ? ? ? ? ?    ?Assessment & Plan:  ? ? ? ?   ?Assessment & Plan:  ? ?Problem List Items Addressed This Visit   ? ?  ? Unprioritized  ? Skin rash  ? Relevant Medications  ? betamethasone dipropionate 0.05 % cream  ? Routine general medical examination at a health care facility - Primary  ?  Continue healthy diet and regular  exercise. Mammo/colo up to date. Declines covid vaccines.  ? ?  ?  ? Osteopenia  ?  Declines dexa this year. ? ?  ?  ? ?Other Visit Diagnoses   ? ? Hyperthyroidism      ? Relevant  Orders  ? TSH  ? Hyperlipidemia, unspecified hyperlipidemia type      ? Relevant Orders  ? Comp Met (CMET)  ? Lipid panel  ? ?  ? ? ?I have discontinued Meghen M. Jagoda's predniSONE. I am also having her start on betamethasone dipropionate. Additionally, I am having her maintain her COLLAGEN PO, Calcium Carbonate-Vit D-Min (CALCIUM 1200 PO), Probiotic Product (ALIGN PO), levocetirizine, guaiFENesin-codeine, and estrogens (conjugated). ? ?Meds ordered this encounter  ?Medications  ? betamethasone dipropionate 0.05 % cream  ?  Sig: Apply to the skin 2 times daily.  ?  Dispense:  30 g  ?  Refill:  1  ?  Order Specific Question:   Supervising Provider  ?  Answer:   Penni Homans A [[id number]]  ? ?

## 2021-09-15 NOTE — Assessment & Plan Note (Signed)
Continue healthy diet and regular exercise. Mammo/colo up to date. Declines covid vaccines.  ?

## 2021-09-15 NOTE — Assessment & Plan Note (Signed)
Declines dexa this year. ?

## 2021-09-15 NOTE — Assessment & Plan Note (Signed)
Uncontrolled. Start betamethasone.  ?

## 2021-09-15 NOTE — Patient Instructions (Signed)
Please complete lab work prior to leaving.   

## 2021-10-12 ENCOUNTER — Other Ambulatory Visit (HOSPITAL_COMMUNITY): Payer: Self-pay

## 2021-11-06 ENCOUNTER — Other Ambulatory Visit (HOSPITAL_COMMUNITY): Payer: Self-pay

## 2021-11-17 ENCOUNTER — Other Ambulatory Visit (HOSPITAL_COMMUNITY): Payer: Self-pay

## 2021-12-12 ENCOUNTER — Other Ambulatory Visit (HOSPITAL_COMMUNITY): Payer: Self-pay

## 2022-01-11 ENCOUNTER — Other Ambulatory Visit (HOSPITAL_COMMUNITY): Payer: Self-pay

## 2022-02-08 ENCOUNTER — Other Ambulatory Visit (HOSPITAL_COMMUNITY): Payer: Self-pay

## 2022-02-13 ENCOUNTER — Other Ambulatory Visit (HOSPITAL_COMMUNITY): Payer: Self-pay

## 2022-03-14 ENCOUNTER — Other Ambulatory Visit: Payer: Self-pay | Admitting: Family

## 2022-03-14 ENCOUNTER — Other Ambulatory Visit (HOSPITAL_COMMUNITY): Payer: Self-pay

## 2022-03-15 ENCOUNTER — Other Ambulatory Visit (HOSPITAL_COMMUNITY): Payer: Self-pay

## 2022-03-15 MED ORDER — ESTROGENS CONJUGATED 0.3 MG PO TABS
0.3000 mg | ORAL_TABLET | Freq: Every day | ORAL | 5 refills | Status: DC
  Filled 2022-03-15: qty 30, 30d supply, fill #0
  Filled 2022-04-16: qty 30, 30d supply, fill #1
  Filled 2022-05-16: qty 30, 30d supply, fill #2
  Filled 2022-06-15: qty 30, 30d supply, fill #3
  Filled 2022-07-12: qty 30, 30d supply, fill #4
  Filled 2022-08-15: qty 30, 30d supply, fill #5

## 2022-04-03 ENCOUNTER — Other Ambulatory Visit: Payer: Self-pay | Admitting: Family

## 2022-04-04 ENCOUNTER — Other Ambulatory Visit (HOSPITAL_COMMUNITY): Payer: Self-pay

## 2022-04-04 ENCOUNTER — Other Ambulatory Visit (HOSPITAL_BASED_OUTPATIENT_CLINIC_OR_DEPARTMENT_OTHER): Payer: Self-pay

## 2022-04-04 MED ORDER — LEVOCETIRIZINE DIHYDROCHLORIDE 5 MG PO TABS
5.0000 mg | ORAL_TABLET | Freq: Every evening | ORAL | 1 refills | Status: DC
  Filled 2022-04-04 (×2): qty 90, 90d supply, fill #0
  Filled 2022-07-12: qty 90, 90d supply, fill #1

## 2022-04-05 ENCOUNTER — Encounter: Payer: Self-pay | Admitting: Family

## 2022-04-05 ENCOUNTER — Other Ambulatory Visit (HOSPITAL_BASED_OUTPATIENT_CLINIC_OR_DEPARTMENT_OTHER): Payer: Self-pay

## 2022-04-05 ENCOUNTER — Encounter: Payer: Self-pay | Admitting: Internal Medicine

## 2022-04-05 ENCOUNTER — Ambulatory Visit: Payer: 59 | Admitting: Internal Medicine

## 2022-04-05 VITALS — BP 120/78 | HR 61 | Temp 98.2°F | Resp 16 | Ht 61.0 in | Wt 141.4 lb

## 2022-04-05 DIAGNOSIS — J40 Bronchitis, not specified as acute or chronic: Secondary | ICD-10-CM

## 2022-04-05 MED ORDER — BENZONATATE 200 MG PO CAPS
200.0000 mg | ORAL_CAPSULE | Freq: Three times a day (TID) | ORAL | 0 refills | Status: DC | PRN
  Filled 2022-04-05: qty 20, 7d supply, fill #0

## 2022-04-05 MED ORDER — AZITHROMYCIN 250 MG PO TABS
ORAL_TABLET | ORAL | 0 refills | Status: DC
  Filled 2022-04-05: qty 6, 5d supply, fill #0

## 2022-04-05 NOTE — Patient Instructions (Addendum)
  For cough:  Take Mucinex DM or Robitussin-DM OTC.  Follow the instructions in the box. You can also take benzonatate 3 times daily as needed Okay to continue using NyQuil if you feel it helps best  For nasal congestion:  -Use OTC Astepro 2 nasal sprays on each side of the nose twice daily until better  Avoid decongestants such as  Pseudoephedrine or phenylephrine    Take the antibiotic Zithromax in 2 to 3 days if you are not gradually better  Call anytime if the symptoms are severe, you have high fever, short of breath, chest pain

## 2022-04-05 NOTE — Progress Notes (Signed)
   Subjective:    Patient ID: Erica Diaz, female    DOB: 1960-10-29, 61 y.o.   MRN: [id number]  DOS:  04/05/2022 Type of visit - description:Acute Symptoms started about a week ago: Cough, worse at night, headache, sinus congestion, some achiness. Denies fever chills. No nausea vomiting.  No diarrhea.  No chest pain or difficulty breathing. 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 Has right ovary   CESAREAN SECTION  1994   ear surgery  2000   repair of R TM   WISDOM TOOTH EXTRACTION  1998    Current Outpatient Medications  Medication Instructions   azithromycin (ZITHROMAX Z-PAK) 250 MG tablet Take 2 tablets by mouth on the first day, then 1 tablet a day for 4 days.   benzonatate (TESSALON) 200 mg, Oral, 3 times daily PRN   betamethasone dipropionate 0.05 % cream Apply to the skin 2 times daily.   Calcium Carbonate-Vit D-Min (CALCIUM 1200 PO) Oral   COLLAGEN PO 1 capsule, Oral, Daily   guaiFENesin-codeine 100-10 MG/5ML syrup 5 mLs, Oral, 3 times daily PRN   levocetirizine (XYZAL) 5 mg, Oral, Every evening   Premarin 0.3 mg, Oral, Daily   Probiotic Product (ALIGN PO) Oral       Objective:   Physical Exam BP 120/78   Pulse 61   Temp 98.2 F (36.8 C) (Oral)   Resp 16   Ht '5\' 1"'$  (1.549 m)   Wt 141 lb 6 oz (64.1 kg)   SpO2 98%   BMI 26.71 kg/m  General:   Well developed, NAD, BMI noted. HEENT:  Normocephalic . Face symmetric, atraumatic TMs: Left normal, right slightly obscured by wax but normal Throat: Symmetric, no white patches. Lungs:  Few rhonchi with cough. Normal respiratory effort, no intercostal retractions, no accessory muscle use. Heart: RRR,  no murmur.  Lower extremities: no  pretibial edema bilaterally  Skin: Not pale. Not jaundice Neurologic:  alert & oriented X3.  Speech normal, gait appropriate for age and unassisted Psych--  Cognition and judgment appear intact.  Cooperative with normal attention span and concentration.  Behavior appropriate. No anxious or depressed appearing.      Assessment    61 year old female, PMH includes thyroid disease, osteopenia, endometriosis, cervical cancer history of hysterectomy, presents with:  Bronchitis: Possibly viral, recommend Mucinex, Astepro, benzonatate. She also has NyQuil at home and will be okay to simply continue with that. If not better in few days to start Zithromax. See instructions

## 2022-04-17 ENCOUNTER — Other Ambulatory Visit (HOSPITAL_COMMUNITY): Payer: Self-pay

## 2022-05-17 ENCOUNTER — Other Ambulatory Visit (HOSPITAL_COMMUNITY): Payer: Self-pay

## 2022-06-15 ENCOUNTER — Other Ambulatory Visit (HOSPITAL_COMMUNITY): Payer: Self-pay

## 2022-07-12 ENCOUNTER — Other Ambulatory Visit: Payer: Self-pay

## 2022-07-12 ENCOUNTER — Other Ambulatory Visit (HOSPITAL_COMMUNITY): Payer: Self-pay

## 2022-08-09 ENCOUNTER — Telehealth: Payer: Self-pay | Admitting: Family

## 2022-08-09 DIAGNOSIS — M8580 Other specified disorders of bone density and structure, unspecified site: Secondary | ICD-10-CM

## 2022-08-09 NOTE — Telephone Encounter (Signed)
Pt wants to have her bone density scan at [facility] and would like the referral/order to be sent to that location. Please advise when sent.

## 2022-08-14 NOTE — Telephone Encounter (Signed)
Order placed

## 2022-08-14 NOTE — Addendum Note (Signed)
Addended by: [name] on: 08/14/2022 12:04 PM   Modules accepted: Orders

## 2022-08-15 ENCOUNTER — Other Ambulatory Visit (HOSPITAL_COMMUNITY): Payer: Self-pay

## 2022-08-23 DIAGNOSIS — R92333 Mammographic heterogeneous density, bilateral breasts: Secondary | ICD-10-CM | POA: Diagnosis not present

## 2022-08-23 DIAGNOSIS — Z1231 Encounter for screening mammogram for malignant neoplasm of breast: Secondary | ICD-10-CM | POA: Diagnosis not present

## 2022-08-23 LAB — HM MAMMOGRAPHY

## 2022-09-11 ENCOUNTER — Other Ambulatory Visit: Payer: Self-pay | Admitting: Family

## 2022-09-12 ENCOUNTER — Other Ambulatory Visit: Payer: Self-pay

## 2022-09-12 ENCOUNTER — Other Ambulatory Visit (HOSPITAL_COMMUNITY): Payer: Self-pay

## 2022-09-12 MED ORDER — ESTROGENS CONJUGATED 0.3 MG PO TABS
0.3000 mg | ORAL_TABLET | Freq: Every day | ORAL | 5 refills | Status: DC
Start: 2022-09-12 — End: 2022-10-09
  Filled 2022-09-12: qty 30, 30d supply, fill #0
  Filled 2022-10-05 – 2022-10-08 (×2): qty 30, 30d supply, fill #1

## 2022-09-18 ENCOUNTER — Encounter: Payer: 59 | Admitting: Family

## 2022-10-05 ENCOUNTER — Other Ambulatory Visit (HOSPITAL_COMMUNITY): Payer: Self-pay

## 2022-10-08 NOTE — Progress Notes (Signed)
Subjective:   By signing my name below, I, Isabelle Course, attest that this documentation has been prepared under the direction and in the presence of Lemont Fillers, NP 10/09/22   Patient ID: Erica Diaz, female    DOB: 1961-01-27, 62 y.o.   MRN: [id number]  Chief Complaint  Patient presents with   Annual Exam    HPI Patient is in today for a comprehensive physical exam.   Sciatic pain: She complains of right sided sciatica in right hip, knee, and leg. She endorses a shooting pain radiating down her leg from her buttock. She states it is only on her right side. She has taken Meloxicam, which has helped.   Acute: She denies having any fever, new moles, congestion, sinus pain, sore throat, chest pain, palpitations, cough, SOB, wheezing, n/v/d, constipation, blood in stool, dysuria, frequency, hematuria, or headaches at this time.   Immunizations: UTD. She is receptive to take the flu vaccine next season.   Diet/Exercise: She is exercising regularly. She is also active while at work.   Last colonoscopy: 10/08/2014. Results were normal. Next due 2026.   Last mammogram: 08/23/2022. Results were normal.   Last pap: hysterectomy.   Last bone density: 12/01/2018. Osteopenia  Dental: She is UTD on routine dental care.  Vision: She is not UTD on routine vision care.  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 Has right ovary   CESAREAN SECTION  1994   ear surgery  2000   repair of R TM   WISDOM TOOTH EXTRACTION  1998    Family History  Problem Relation Age of Onset   Diabetes Mother    Heart disease Mother    Heart disease Father 45   Dementia Father    Diabetes Maternal Grandfather    Heart failure Brother    Throat cancer Brother    Peripheral vascular disease  Brother        ?PVD-had partial amputation   COPD Brother    Drug abuse Brother    CVA Sister    Colon cancer Neg Hx     Social History   Socioeconomic History   Marital status: Married    Spouse name: Not on file   Number of children: 1   Years of education: Not on file   Highest education level: Not on file  Occupational History    Employer: [employer]  Tobacco Use   Smoking status: Former    Years: 8    Types: Cigarettes    Quit date: 05/21/1990    Years since quitting: 32.4   Smokeless tobacco: Never  Vaping Use   Vaping Use: Never used  Substance and Sexual Activity   Alcohol use: No    Alcohol/week: 0.0 standard drinks of alcohol   Drug use: No   Sexual activity: Yes    Partners: Male  Other Topics Concern   Not on file  Social History Narrative   Caffeine:  1 cup coffee dailyRegular exercise:     Active work life. (walks a lot on job)   1 biological child, 2 step childrenWorks in environmental services at [employer]    One daughter, 2 step daughters.    Has one granddaughter and one grandson   Social Determinants  of Health   Financial Resource Strain: Not on file  Food Insecurity: Not on file  Transportation Needs: Not on file  Physical Activity: Not on file  Stress: Not on file  Social Connections: Not on file  Intimate Partner Violence: Not on file    Outpatient Medications Prior to Visit  Medication Sig Dispense Refill   betamethasone dipropionate 0.05 % cream Apply to the skin 2 times daily. 30 g 1   Calcium Carbonate-Vit D-Min (CALCIUM 1200 PO) Take by mouth.     COLLAGEN PO Take 1 capsule by mouth daily.     levocetirizine (XYZAL) 5 MG tablet Take 1 tablet (5 mg total) by mouth every evening. 90 tablet 1   Probiotic Product (ALIGN PO) Take by mouth.     estrogens, conjugated, (PREMARIN) 0.3 MG tablet Take 1 tablet (0.3 mg total) by mouth daily. 30 tablet 5   azithromycin (ZITHROMAX Z-PAK) 250 MG tablet Take 2 tablets by mouth on the first day,  then 1 tablet a day for 4 days. 6 tablet 0   benzonatate (TESSALON) 200 MG capsule Take 1 capsule (200 mg total) by mouth 3 (three) times daily as needed for cough. 20 capsule 0   guaiFENesin-codeine 100-10 MG/5ML syrup Take 5 mLs by mouth 3 (three) times daily as needed for cough. (Patient not taking: Reported on 04/05/2022) 75 mL 0   No facility-administered medications prior to visit.    Allergies  Allergen Reactions   Thorazine [Chlorpromazine Hcl] Other (See Comments)    Neurologic disturbance   Codeine Itching   Sulfa Drugs Cross Reactors Hives    Review of Systems  Musculoskeletal:  Positive for joint pain (right hip and knee) and myalgias (right leg).       Objective:    Physical Exam Constitutional:      General: She is not in acute distress.    Appearance: Normal appearance. She is not ill-appearing.  HENT:     Head: Normocephalic and atraumatic.     Right Ear: Tympanic membrane, ear canal and external ear normal.     Left Ear: Tympanic membrane, ear canal and external ear normal.  Eyes:     Extraocular Movements: Extraocular movements intact.     Right eye: No nystagmus.     Left eye: No nystagmus.     Pupils: Pupils are equal, round, and reactive to light.  Neck:     Thyroid: No thyroid tenderness.  Cardiovascular:     Rate and Rhythm: Normal rate and regular rhythm.     Heart sounds: Normal heart sounds. No murmur heard.    No gallop.  Pulmonary:     Effort: Pulmonary effort is normal. No respiratory distress.     Breath sounds: Normal breath sounds. No wheezing or rales.  Abdominal:     General: There is no distension.     Palpations: Abdomen is soft.     Tenderness: There is no abdominal tenderness. There is no guarding.  Musculoskeletal:     Comments: 5/5 strength in both upper and lower extremities  Lymphadenopathy:     Cervical: No cervical adenopathy.  Skin:    General: Skin is warm and dry.  Neurological:     Mental Status: She is alert and  oriented to person, place, and time.     Deep Tendon Reflexes:     Reflex Scores:      Patellar reflexes are 2+ on the right side and 2+ on the left side. Psychiatric:  Judgment: Judgment normal.     BP 137/67 (BP Location: Right Arm, Patient Position: Sitting, Cuff Size: Small)   Pulse 65   Temp 97.7 F (36.5 C) (Oral)   Resp 16   Wt 140 lb (63.5 kg)   SpO2 100%   BMI 26.45 kg/m  Wt Readings from Last 3 Encounters:  10/09/22 140 lb (63.5 kg)  04/05/22 141 lb 6 oz (64.1 kg)  09/15/21 139 lb (63 kg)       Assessment & Plan:  Hyperthyroidism -     TSH  Hyperlipidemia, unspecified hyperlipidemia type -     Comprehensive metabolic panel -     Lipid panel  Preventative health care -     CBC with Differential/Platelet  Encounter for screening for HIV -     HIV Antibody (routine testing w rflx)  Osteopenia, unspecified location Assessment & Plan: Repeat dexa. Continue calcium supplement and walking.  Orders: -     DG Bone Density; Future  Routine general medical examination at a health care facility Assessment & Plan: Continue healthy diet, exercise.  Refer for Dexa.  Mammo up to date.  Recommended flu shot and covid booster in the fall. She declines covid booster.    Sciatica of right side Assessment & Plan: New. Uncontrolled.  Recommended meloxicam once daily prn- alternate with tylenol on better days.  Pt given back exercises to do at home.    Menopausal syndrome Assessment & Plan: Stable with premarin. Continue same.    Other orders -     Meloxicam; Take 1 tablet (7.5 mg total) by mouth daily as needed for pain.  Dispense: 30 tablet; Refill: 0 -     Estrogens Conjugated; Take 1 tablet (0.3 mg total) by mouth daily.  Dispense: 30 tablet; Refill: 5     I,Rachel Rivera,acting as a scribe for Lemont Fillers, NP.,have documented all relevant documentation on the behalf of Lemont Fillers, NP,as directed by  Lemont Fillers, NP while in  the presence of Lemont Fillers, NP.   I, Lemont Fillers, NP, personally preformed the services described in this documentation.  All medical record entries made by the scribe were at my direction and in my presence.  I have reviewed the chart and discharge instructions (if applicable) and agree that the record reflects my personal performance and is accurate and complete. 10/09/22   Lemont Fillers, NP

## 2022-10-09 ENCOUNTER — Other Ambulatory Visit (HOSPITAL_BASED_OUTPATIENT_CLINIC_OR_DEPARTMENT_OTHER): Payer: Self-pay

## 2022-10-09 ENCOUNTER — Ambulatory Visit (INDEPENDENT_AMBULATORY_CARE_PROVIDER_SITE_OTHER): Payer: Commercial Managed Care - PPO | Admitting: Family

## 2022-10-09 ENCOUNTER — Other Ambulatory Visit (HOSPITAL_COMMUNITY): Payer: Self-pay

## 2022-10-09 ENCOUNTER — Encounter: Payer: Self-pay | Admitting: Family

## 2022-10-09 VITALS — BP 137/67 | HR 65 | Temp 97.7°F | Resp 16 | Wt 140.0 lb

## 2022-10-09 DIAGNOSIS — Z114 Encounter for screening for human immunodeficiency virus [HIV]: Secondary | ICD-10-CM

## 2022-10-09 DIAGNOSIS — E0590 Thyrotoxicosis, unspecified without thyrotoxic crisis or storm: Secondary | ICD-10-CM | POA: Diagnosis not present

## 2022-10-09 DIAGNOSIS — N951 Menopausal and female climacteric states: Secondary | ICD-10-CM | POA: Diagnosis not present

## 2022-10-09 DIAGNOSIS — M8580 Other specified disorders of bone density and structure, unspecified site: Secondary | ICD-10-CM | POA: Diagnosis not present

## 2022-10-09 DIAGNOSIS — Z0000 Encounter for general adult medical examination without abnormal findings: Secondary | ICD-10-CM

## 2022-10-09 DIAGNOSIS — M5431 Sciatica, right side: Secondary | ICD-10-CM

## 2022-10-09 DIAGNOSIS — E785 Hyperlipidemia, unspecified: Secondary | ICD-10-CM | POA: Diagnosis not present

## 2022-10-09 LAB — CBC WITH DIFFERENTIAL/PLATELET
Basophils Absolute: 0 10*3/uL (ref 0.0–0.1)
Basophils Relative: 0.5 % (ref 0.0–3.0)
Eosinophils Absolute: 0.2 10*3/uL (ref 0.0–0.7)
Eosinophils Relative: 4.7 % (ref 0.0–5.0)
HCT: 40.5 % (ref 36.0–46.0)
Hemoglobin: 13.3 g/dL (ref 12.0–15.0)
Lymphocytes Relative: 34.4 % (ref 12.0–46.0)
Lymphs Abs: 1.4 10*3/uL (ref 0.7–4.0)
MCHC: 32.9 g/dL (ref 30.0–36.0)
MCV: 89.2 fl (ref 78.0–100.0)
Monocytes Absolute: 0.2 10*3/uL (ref 0.1–1.0)
Monocytes Relative: 6.1 % (ref 3.0–12.0)
Neutro Abs: 2.1 10*3/uL (ref 1.4–7.7)
Neutrophils Relative %: 54.3 % (ref 43.0–77.0)
Platelets: 286 10*3/uL (ref 150.0–400.0)
RBC: 4.55 Mil/uL (ref 3.87–5.11)
RDW: 12.8 % (ref 11.5–15.5)
WBC: 3.9 10*3/uL — ABNORMAL LOW (ref 4.0–10.5)

## 2022-10-09 LAB — LIPID PANEL
Cholesterol: 206 mg/dL — ABNORMAL HIGH (ref 0–200)
HDL: 81.8 mg/dL (ref >39.00)
LDL Cholesterol: 112 mg/dL — ABNORMAL HIGH (ref 0–99)
NonHDL: 124.16
Total CHOL/HDL Ratio: 3
Triglycerides: 60 mg/dL (ref 0.0–149.0)
VLDL: 12 mg/dL (ref 0.0–40.0)

## 2022-10-09 LAB — TSH: TSH: 0.84 u[IU]/mL (ref 0.35–5.50)

## 2022-10-09 LAB — COMPREHENSIVE METABOLIC PANEL
ALT: 12 U/L (ref 0–35)
AST: 17 U/L (ref 0–37)
Albumin: 4.3 g/dL (ref 3.5–5.2)
Alkaline Phosphatase: 66 U/L (ref 39–117)
BUN: 14 mg/dL (ref 6–23)
CO2: 24 mEq/L (ref 19–32)
Calcium: 9.4 mg/dL (ref 8.4–10.5)
Chloride: 105 mEq/L (ref 96–112)
Creatinine, Ser: 0.75 mg/dL (ref 0.40–1.20)
GFR: 85.39 mL/min (ref >60.00)
Glucose, Bld: 81 mg/dL (ref 70–99)
Potassium: 4.6 mEq/L (ref 3.5–5.1)
Sodium: 139 mEq/L (ref 135–145)
Total Bilirubin: 0.7 mg/dL (ref 0.2–1.2)
Total Protein: 7 g/dL (ref 6.0–8.3)

## 2022-10-09 MED ORDER — ESTROGENS CONJUGATED 0.3 MG PO TABS
0.3000 mg | ORAL_TABLET | Freq: Every day | ORAL | 5 refills | Status: DC
  Filled 2022-10-09 – 2022-11-15 (×2): qty 30, 30d supply, fill #0
  Filled 2022-12-11 – 2022-12-17 (×2): qty 30, 30d supply, fill #1
  Filled 2023-01-14: qty 30, 30d supply, fill #2
  Filled 2023-02-13: qty 30, 30d supply, fill #3
  Filled 2023-03-13: qty 30, 30d supply, fill #4
  Filled 2023-04-15 – 2023-04-16 (×2): qty 30, 30d supply, fill #5

## 2022-10-09 MED ORDER — MELOXICAM 7.5 MG PO TABS
7.5000 mg | ORAL_TABLET | Freq: Every day | ORAL | 0 refills | Status: DC | PRN
  Filled 2022-10-09: qty 30, 30d supply, fill #0

## 2022-10-09 NOTE — Assessment & Plan Note (Signed)
Continue healthy diet, exercise.  Refer for Dexa.  Mammo up to date.  Recommended flu shot and covid booster in the fall. She declines covid booster.

## 2022-10-09 NOTE — Assessment & Plan Note (Signed)
Repeat dexa. Continue calcium supplement and walking.

## 2022-10-09 NOTE — Assessment & Plan Note (Signed)
New. Uncontrolled.  Recommended meloxicam once daily prn- alternate with tylenol on better days.  Pt given back exercises to do at home.

## 2022-10-09 NOTE — Assessment & Plan Note (Signed)
Stable with premarin. Continue same.

## 2022-10-10 LAB — HIV ANTIBODY (ROUTINE TESTING W REFLEX): HIV 1&2 Ab, 4th Generation: NONREACTIVE

## 2022-11-07 ENCOUNTER — Telehealth: Payer: Self-pay | Admitting: Family

## 2022-11-07 MED ORDER — CYCLOBENZAPRINE HCL 5 MG PO TABS
5.0000 mg | ORAL_TABLET | Freq: Three times a day (TID) | ORAL | 0 refills | Status: DC | PRN
  Filled 2022-11-07 – 2022-11-08 (×2): qty 30, 10d supply, fill #0

## 2022-11-07 NOTE — Telephone Encounter (Signed)
Patient would like a call back and questions regarding the medication MOBIC 7.5mg 

## 2022-11-07 NOTE — Telephone Encounter (Signed)
Spoke with patient and she states that the meloxicam is not working and upsets her stomach (she takes with food).  She has been taking her daughter muscle relaxant and it works well but did not know the name (a white pill). She wants to know if you will send in something else for her?

## 2022-11-07 NOTE — Telephone Encounter (Signed)
Pt notified and will pick up at pharmacy .

## 2022-11-07 NOTE — Addendum Note (Signed)
Addended by: Thelma Barge D on: 11/07/2022 04:51 PM   Modules accepted: Orders

## 2022-11-08 ENCOUNTER — Other Ambulatory Visit (HOSPITAL_COMMUNITY): Payer: Self-pay

## 2022-11-08 ENCOUNTER — Other Ambulatory Visit: Payer: Self-pay

## 2022-11-15 ENCOUNTER — Other Ambulatory Visit (HOSPITAL_COMMUNITY): Payer: Self-pay

## 2022-11-16 ENCOUNTER — Other Ambulatory Visit: Payer: Self-pay

## 2022-11-29 ENCOUNTER — Other Ambulatory Visit: Payer: Self-pay

## 2022-12-11 ENCOUNTER — Other Ambulatory Visit: Payer: Self-pay | Admitting: Family

## 2022-12-11 ENCOUNTER — Other Ambulatory Visit (HOSPITAL_COMMUNITY): Payer: Self-pay

## 2022-12-11 MED ORDER — LEVOCETIRIZINE DIHYDROCHLORIDE 5 MG PO TABS
5.0000 mg | ORAL_TABLET | Freq: Every evening | ORAL | 1 refills | Status: DC
  Filled 2022-12-11 – 2023-04-23 (×2): qty 90, 90d supply, fill #0
  Filled 2023-07-24: qty 90, 90d supply, fill #1

## 2022-12-12 ENCOUNTER — Other Ambulatory Visit (HOSPITAL_COMMUNITY): Payer: Self-pay

## 2022-12-14 ENCOUNTER — Other Ambulatory Visit: Payer: Self-pay | Admitting: Family

## 2022-12-14 ENCOUNTER — Other Ambulatory Visit (HOSPITAL_COMMUNITY): Payer: Self-pay

## 2022-12-14 MED ORDER — CYCLOBENZAPRINE HCL 5 MG PO TABS
5.0000 mg | ORAL_TABLET | Freq: Three times a day (TID) | ORAL | 0 refills | Status: DC | PRN
  Filled 2022-12-14 – 2022-12-17 (×2): qty 30, 10d supply, fill #0

## 2022-12-14 MED ORDER — MELOXICAM 7.5 MG PO TABS
7.5000 mg | ORAL_TABLET | Freq: Every day | ORAL | 0 refills | Status: DC | PRN
  Filled 2022-12-14 – 2022-12-17 (×2): qty 30, 30d supply, fill #0

## 2022-12-17 ENCOUNTER — Other Ambulatory Visit (HOSPITAL_COMMUNITY): Payer: Self-pay

## 2022-12-24 ENCOUNTER — Other Ambulatory Visit (HOSPITAL_COMMUNITY): Payer: Self-pay

## 2023-01-14 ENCOUNTER — Other Ambulatory Visit (HOSPITAL_COMMUNITY): Payer: Self-pay

## 2023-02-14 ENCOUNTER — Other Ambulatory Visit (HOSPITAL_COMMUNITY): Payer: Self-pay

## 2023-03-04 ENCOUNTER — Other Ambulatory Visit: Payer: Self-pay | Admitting: Family

## 2023-03-14 ENCOUNTER — Other Ambulatory Visit (HOSPITAL_COMMUNITY): Payer: Self-pay

## 2023-04-15 ENCOUNTER — Other Ambulatory Visit (HOSPITAL_COMMUNITY): Payer: Self-pay

## 2023-04-16 ENCOUNTER — Other Ambulatory Visit (HOSPITAL_COMMUNITY): Payer: Self-pay

## 2023-04-24 ENCOUNTER — Other Ambulatory Visit: Payer: Self-pay

## 2023-04-24 ENCOUNTER — Other Ambulatory Visit (HOSPITAL_COMMUNITY): Payer: Self-pay

## 2023-04-29 ENCOUNTER — Other Ambulatory Visit (HOSPITAL_COMMUNITY): Payer: Self-pay

## 2023-05-13 ENCOUNTER — Other Ambulatory Visit: Payer: Self-pay

## 2023-05-13 ENCOUNTER — Other Ambulatory Visit: Payer: Self-pay | Admitting: Family

## 2023-05-13 ENCOUNTER — Other Ambulatory Visit (HOSPITAL_COMMUNITY): Payer: Self-pay

## 2023-05-13 MED ORDER — ESTROGENS CONJUGATED 0.3 MG PO TABS
0.3000 mg | ORAL_TABLET | Freq: Every day | ORAL | 1 refills | Status: DC
  Filled 2023-05-13: qty 30, 30d supply, fill #0
  Filled 2023-06-13: qty 30, 30d supply, fill #1

## 2023-05-21 ENCOUNTER — Ambulatory Visit: Payer: Commercial Managed Care - PPO | Admitting: Family Medicine

## 2023-05-29 IMAGING — DX DG CHEST 2V
2 series · 2 of 2 positions shown · non-contrast
Comparison: None.

CLINICAL DATA: Cough.  Evaluate for pneumonia.

EXAM:
CHEST - 2 VIEW

[chest pa]
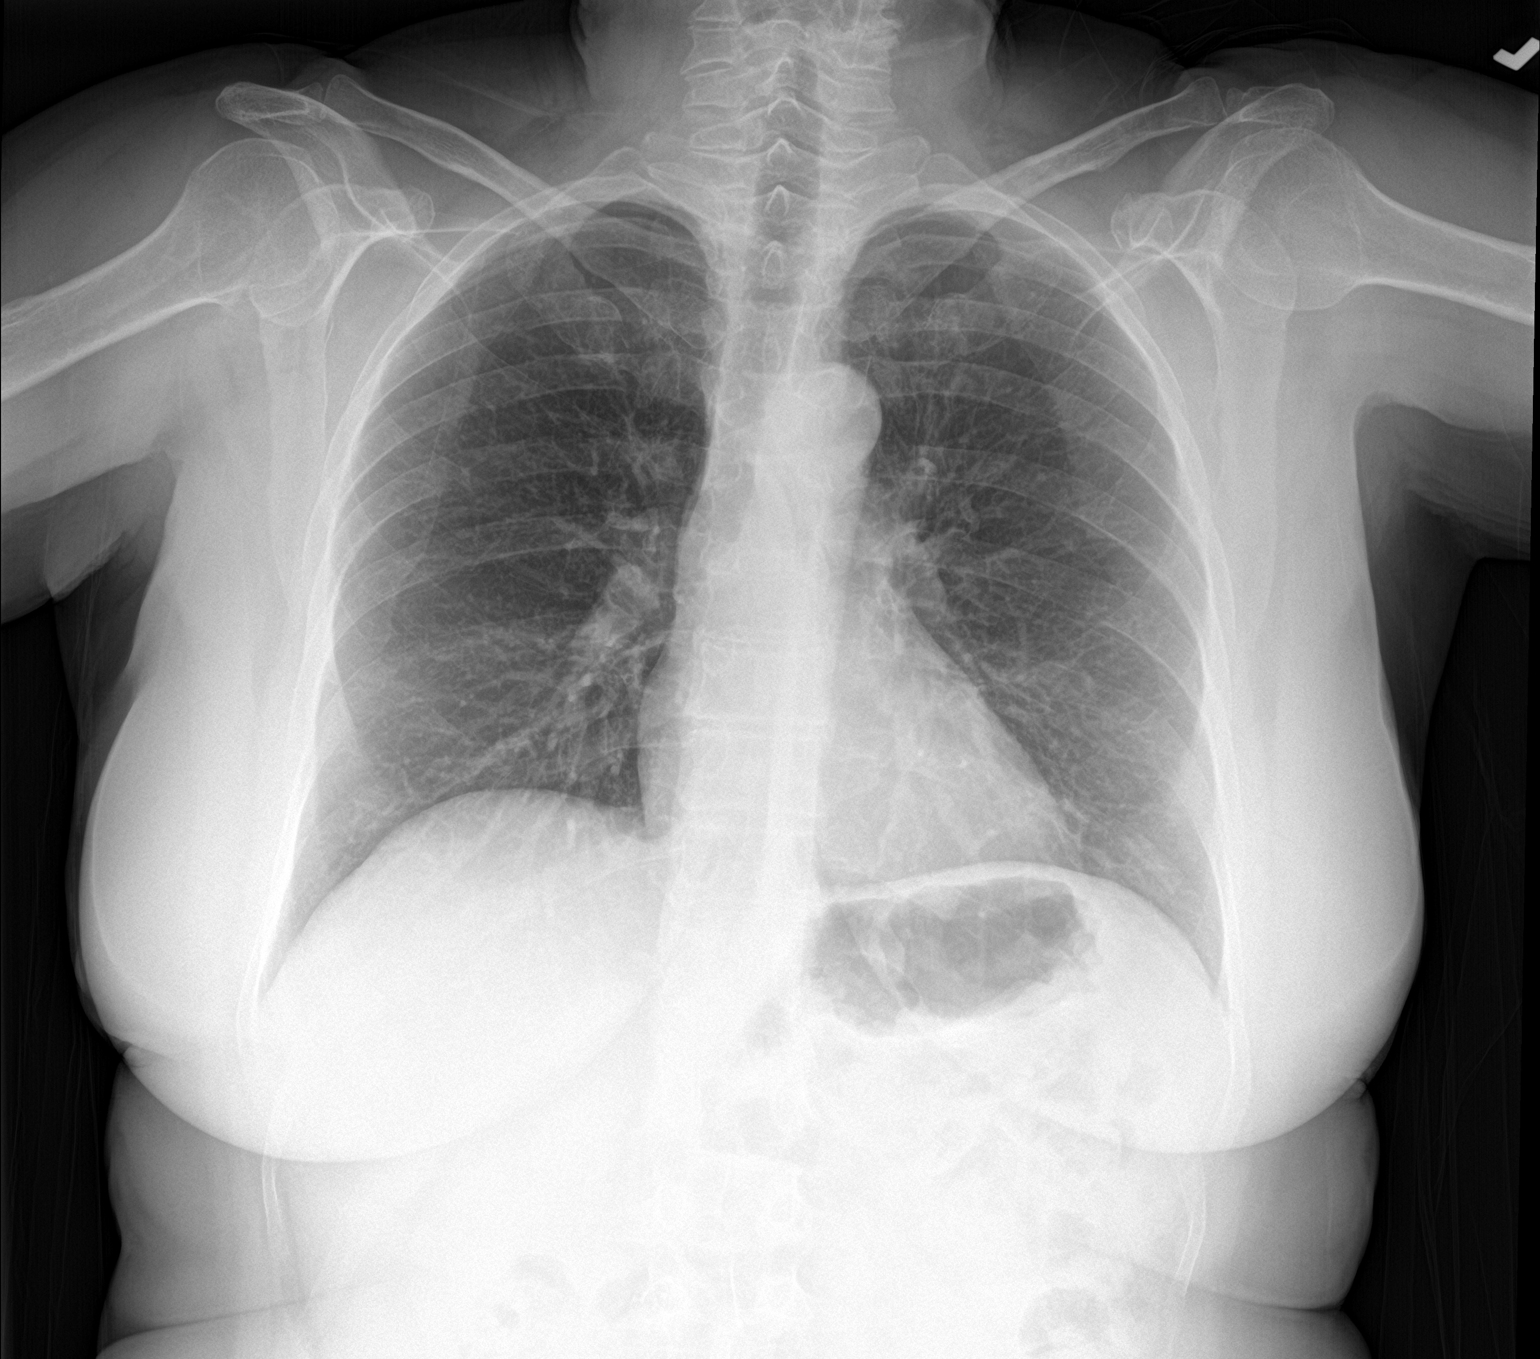

[chest lat]
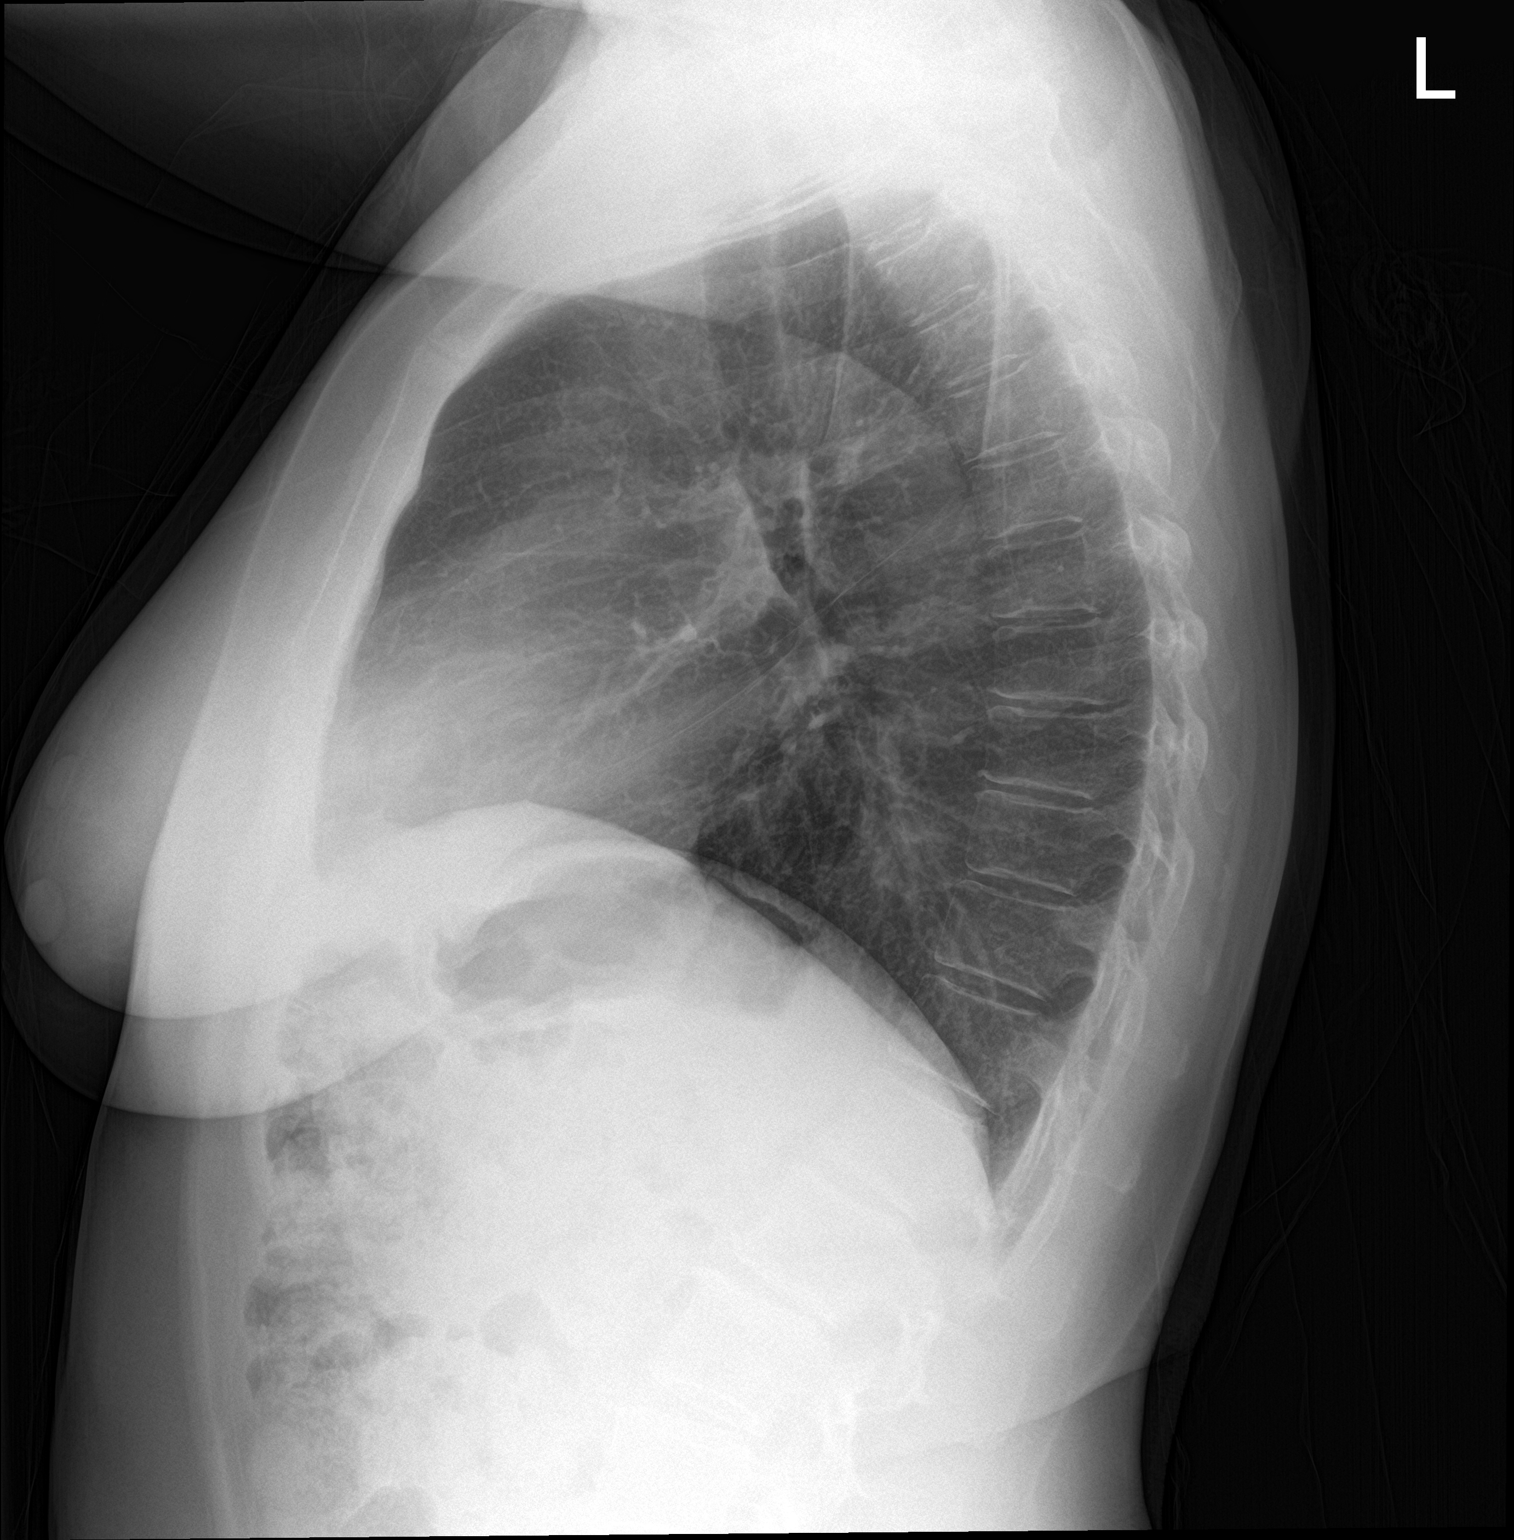

[2 of 2 positions shown; findings below may reference images not displayed]

FINDINGS: The heart size and mediastinal contours are within normal limits.
Both lungs are clear. No pleural effusion or pneumothorax. The
visualized skeletal structures are unremarkable.
IMPRESSION: No active cardiopulmonary disease.

## 2023-06-13 ENCOUNTER — Other Ambulatory Visit: Payer: Self-pay | Admitting: Family

## 2023-06-13 ENCOUNTER — Other Ambulatory Visit: Payer: Self-pay

## 2023-06-13 ENCOUNTER — Other Ambulatory Visit (HOSPITAL_COMMUNITY): Payer: Self-pay

## 2023-06-13 DIAGNOSIS — R21 Rash and other nonspecific skin eruption: Secondary | ICD-10-CM

## 2023-06-13 MED ORDER — BETAMETHASONE DIPROPIONATE 0.05 % EX CREA
TOPICAL_CREAM | Freq: Two times a day (BID) | CUTANEOUS | 1 refills | Status: DC
Start: 2023-06-13 — End: 2023-10-18
  Filled 2023-06-13: qty 30, 15d supply, fill #0
  Filled 2023-06-27: qty 30, 15d supply, fill #1

## 2023-06-27 ENCOUNTER — Other Ambulatory Visit (HOSPITAL_COMMUNITY): Payer: Self-pay

## 2023-07-16 ENCOUNTER — Other Ambulatory Visit: Payer: Self-pay | Admitting: Family

## 2023-07-17 ENCOUNTER — Other Ambulatory Visit: Payer: Self-pay

## 2023-07-17 ENCOUNTER — Other Ambulatory Visit (HOSPITAL_COMMUNITY): Payer: Self-pay

## 2023-07-17 MED ORDER — ESTROGENS CONJUGATED 0.3 MG PO TABS
0.3000 mg | ORAL_TABLET | Freq: Every day | ORAL | 1 refills | Status: DC
  Filled 2023-07-17: qty 30, 30d supply, fill #0
  Filled 2023-08-17: qty 30, 30d supply, fill #1

## 2023-07-24 ENCOUNTER — Other Ambulatory Visit (HOSPITAL_COMMUNITY): Payer: Self-pay

## 2023-07-26 ENCOUNTER — Telehealth: Payer: Self-pay

## 2023-07-26 ENCOUNTER — Encounter: Payer: Self-pay | Admitting: Family Medicine

## 2023-07-26 ENCOUNTER — Ambulatory Visit: Admitting: Family Medicine

## 2023-07-26 ENCOUNTER — Other Ambulatory Visit (HOSPITAL_BASED_OUTPATIENT_CLINIC_OR_DEPARTMENT_OTHER): Payer: Self-pay

## 2023-07-26 VITALS — BP 138/90 | HR 63 | Temp 98.1°F | Resp 16 | Ht 61.0 in | Wt 145.2 lb

## 2023-07-26 DIAGNOSIS — J0140 Acute pansinusitis, unspecified: Secondary | ICD-10-CM | POA: Diagnosis not present

## 2023-07-26 DIAGNOSIS — R051 Acute cough: Secondary | ICD-10-CM | POA: Diagnosis not present

## 2023-07-26 DIAGNOSIS — Z780 Asymptomatic menopausal state: Secondary | ICD-10-CM

## 2023-07-26 MED ORDER — PROMETHAZINE-DM 6.25-15 MG/5ML PO SYRP
5.0000 mL | ORAL_SOLUTION | Freq: Four times a day (QID) | ORAL | 0 refills | Status: DC | PRN
Start: 2023-07-26 — End: 2023-10-18
  Filled 2023-07-26: qty 118, 6d supply, fill #0

## 2023-07-26 MED ORDER — FLUTICASONE PROPIONATE 50 MCG/ACT NA SUSP
2.0000 | Freq: Every day | NASAL | 6 refills | Status: DC
Start: 2023-07-26 — End: 2023-10-18
  Filled 2023-07-26: qty 16, 30d supply, fill #0
  Filled 2023-08-19: qty 16, 30d supply, fill #1
  Filled 2023-09-11 – 2023-09-19 (×2): qty 16, 30d supply, fill #2

## 2023-07-26 MED ORDER — AMOXICILLIN-POT CLAVULANATE 875-125 MG PO TABS
1.0000 | ORAL_TABLET | Freq: Two times a day (BID) | ORAL | 0 refills | Status: DC
  Filled 2023-07-26: qty 20, 10d supply, fill #0

## 2023-07-26 NOTE — Patient Instructions (Signed)

## 2023-07-26 NOTE — Assessment & Plan Note (Signed)
 Phenergan dm as needed

## 2023-07-26 NOTE — Progress Notes (Signed)
 Established Patient Office Visit  Subjective   Patient ID: Erica Diaz, female    DOB: 15-Jul-1960  Age: 63 y.o. MRN: [id number]  Chief Complaint  Patient presents with   Sinus Problem    Sxs on and off for 3 weeks, pt states taking nyquil and alkaselter     HPI Discussed the use of AI scribe software for clinical note transcription with the patient, who gave verbal consent to proceed.  History of Present Illness   The patient presents with ear pain and congestion.  She has been experiencing ear pain and congestion for about three weeks. The symptoms are intermittent, with periods of relief followed by recurrence. The ear pain was particularly severe the previous night, described as throbbing and reminiscent of childhood ear pain. She has a history of surgery on the affected ear, which may contribute to the current discomfort. No fever is reported.  She reports significant congestion and sinus pressure, with a constantly running nose. The symptoms worsen at night, impacting her sleep. She experiences a scratchy throat and a persistent cough that is exacerbated when lying down, further disturbing her sleep. The cough is described as 'horrible' at night, leading to headaches upon waking. No significant coughing up of phlegm is noted.  For symptom management, she uses Flonase every morning and night and takes an antihistamine daily, either Zyrtec or Allegra. She also took Catering manager Plus to manage symptoms during work, despite it causing elevated blood pressure. She is allergic to sulfa drugs but tolerates penicillins. She has not conducted a COVID test.  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 She mentions that her workplace has been affected by widespread illness, with many colleagues calling out sick, which adds to her stress about missing work.      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 Has right ovary   CESAREAN SECTION  1994   ear surgery  2000   repair of R TM   WISDOM TOOTH EXTRACTION  1998   Social History   Tobacco Use   Smoking status: Former    Current packs/day: 0.00    Types: Cigarettes    Start date: 05/21/1982  Quit date: 05/21/1990    Years since quitting: 33.2   Smokeless tobacco: Never  Vaping Use   Vaping status: Never Used  Substance Use Topics   Alcohol use: No    Alcohol/week: 0.0 standard drinks of alcohol   Drug use: No   Social History   Socioeconomic History   Marital status: Married    Spouse name: Not on file   Number of children: 1   Years of education: Not on file   Highest education level: Not on file  Occupational History    Employer: [employer]  Tobacco Use   Smoking status: Former    Current packs/day: 0.00    Types: Cigarettes    Start date: 05/21/1982    Quit date: 05/21/1990    Years since quitting: 33.2   Smokeless tobacco: Never  Vaping Use   Vaping status: Never Used  Substance and Sexual Activity   Alcohol use: No    Alcohol/week: 0.0 standard drinks of alcohol   Drug use: No   Sexual activity: Yes     Partners: Male  Other Topics Concern   Not on file  Social History Narrative   Caffeine:  1 cup coffee dailyRegular exercise:     Active work life. (walks a lot on job)   1 biological child, 2 step childrenWorks in environmental services at [employer],    One daughter, 2 step daughters.    Has one granddaughter and one grandson   Social Drivers of Corporate investment banker Strain: Not on file  Food Insecurity: Not on file  Transportation Needs: Not on file  Physical Activity: Not on file  Stress: Not on file  Social Connections: Unknown (10/01/2021)   [facility]   Social Network    Social Network: Not on file  Intimate Partner Violence: Unknown (08/23/2021)   Received from [facility]   HITS    Physically Hurt: Not on file    Insult or Talk Down To: Not on file    Threaten Physical Harm: Not on file    Scream or Curse: Not on file   Family Status  Relation Name Status   Mother  Deceased       heart failure and renal failure   Father  Deceased       healthy   MGF  (Not Specified)   Sister  Alive   Brother  Deceased   Brother  Alive   Brother  Alive   Brother  Alive   Brother  Alive   Brother  Deceased   Sister  Deceased   Sister  Alive   Neg Hx  (Not Specified)  No partnership data on file   Family History  Problem Relation Age of Onset   Diabetes Mother    Heart disease Mother    Heart disease Father 58   Dementia Father    Diabetes Maternal Grandfather    Heart failure Brother    Throat cancer Brother    Peripheral vascular disease Brother        ?PVD-had partial amputation   COPD Brother    Drug abuse Brother    CVA Sister    Colon cancer Neg Hx    Allergies  Allergen Reactions   Thorazine [Chlorpromazine Hcl] Other (See Comments)    Neurologic disturbance   Codeine Itching   Sulfa Drugs Cross Reactors Hives      Review of Systems  Constitutional:  Negative for chills, fever  and  malaise/fatigue.  HENT:  Negative for congestion and hearing loss.   Eyes:  Negative for blurred vision and discharge.  Respiratory:  Negative for cough, sputum production and shortness of breath.   Cardiovascular:  Negative for chest pain, palpitations and leg swelling.  Gastrointestinal:  Negative for abdominal pain, blood in stool, constipation, diarrhea, heartburn, nausea and vomiting.  Genitourinary:  Negative for dysuria, frequency, hematuria and urgency.  Musculoskeletal:  Negative for back pain, falls and myalgias.  Skin:  Negative for rash.  Neurological:  Negative for dizziness, sensory change, loss of consciousness, weakness and headaches.  Endo/Heme/Allergies:  Negative for environmental allergies. Does not bruise/bleed easily.  Psychiatric/Behavioral:  Negative for depression and suicidal ideas. The patient is not nervous/anxious and does not have insomnia.       Objective:     BP (!) 138/90 (BP Location: Left Arm, Patient Position: Sitting)   Pulse 63   Temp 98.1 F (36.7 C) (Oral)   Resp 16   Ht 5\' 1"  (1.549 m)   Wt 145 lb 3.2 oz (65.9 kg)   SpO2 100%   BMI 27.44 kg/m  BP Readings from Last 3 Encounters:  07/26/23 (!) 138/90  10/09/22 137/67  04/05/22 120/78   Wt Readings from Last 3 Encounters:  07/26/23 145 lb 3.2 oz (65.9 kg)  10/09/22 140 lb (63.5 kg)  04/05/22 141 lb 6 oz (64.1 kg)   SpO2 Readings from Last 3 Encounters:  07/26/23 100%  10/09/22 100%  04/05/22 98%      Physical Exam Vitals and nursing note reviewed.  Constitutional:      General: She is not in acute distress.    Appearance: Normal appearance.  HENT:     Head: Normocephalic and atraumatic.     Right Ear: Tympanic membrane, ear canal and external ear normal. There is no impacted cerumen.     Left Ear: Tympanic membrane, ear canal and external ear normal. There is no impacted cerumen.     Nose: Congestion and rhinorrhea present.     Right Sinus: Maxillary sinus tenderness and  frontal sinus tenderness present.     Left Sinus: Maxillary sinus tenderness and frontal sinus tenderness present.     Mouth/Throat:     Mouth: Mucous membranes are moist.     Pharynx: Oropharynx is clear. No oropharyngeal exudate or posterior oropharyngeal erythema.  Eyes:     General: No scleral icterus.       Right eye: No discharge.        Left eye: No discharge.     Conjunctiva/sclera: Conjunctivae normal.  Cardiovascular:     Rate and Rhythm: Normal rate and regular rhythm.     Heart sounds: Normal heart sounds.  Pulmonary:     Effort: Pulmonary effort is normal. No respiratory distress.     Breath sounds: Normal breath sounds. No wheezing, rhonchi or rales.  Musculoskeletal:     Cervical back: Normal range of motion.  Lymphadenopathy:     Cervical: No cervical adenopathy.  Skin:    General: Skin is warm and dry.  Neurological:     Mental Status: She is alert and oriented to person, place, and time.  Psychiatric:        Mood and Affect: Mood normal.        Behavior: Behavior normal.        Thought Content: Thought content normal.        Judgment: Judgment normal.      No results found for  any visits on 07/26/23.  Last CBC Lab Results  Component Value Date   WBC 3.9 (L) 10/09/2022   HGB 13.3 10/09/2022   HCT 40.5 10/09/2022   MCV 89.2 10/09/2022   RDW 12.8 10/09/2022   PLT 286.0 10/09/2022   Last metabolic panel Lab Results  Component Value Date   GLUCOSE 81 10/09/2022   NA 139 10/09/2022   K 4.6 10/09/2022   CL 105 10/09/2022   CO2 24 10/09/2022   BUN 14 10/09/2022   CREATININE 0.75 10/09/2022   GFR 85.39 10/09/2022   CALCIUM 9.4 10/09/2022   PROT 7.0 10/09/2022   ALBUMIN 4.3 10/09/2022   BILITOT 0.7 10/09/2022   ALKPHOS 66 10/09/2022   AST 17 10/09/2022   ALT 12 10/09/2022   Last lipids Lab Results  Component Value Date   CHOL 206 (H) 10/09/2022   HDL 81.80 10/09/2022   LDLCALC 112 (H) 10/09/2022   TRIG 60.0 10/09/2022   CHOLHDL 3  10/09/2022   Last hemoglobin A1c No results found for: "HGBA1C" Last thyroid functions Lab Results  Component Value Date   TSH 0.84 10/09/2022   Last vitamin D No results found for: "25OHVITD2", "25OHVITD3", "VD25OH" Last vitamin B12 and Folate No results found for: "VITAMINB12", "FOLATE"    The 10-year ASCVD risk score (Arnett DK, et al., 2019) is: 4.3%    Assessment & Plan:   Problem List Items Addressed This Visit       Unprioritized   Acute non-recurrent pansinusitis - Primary   Augmentin bid x 10 days  Flonase and antihistamine  Cough syrup  Return to office as needed       Relevant Medications   fluticasone (FLONASE) 50 MCG/ACT nasal spray   amoxicillin-clavulanate (AUGMENTIN) 875-125 MG tablet   promethazine-dextromethorphan (PROMETHAZINE-DM) 6.25-15 MG/5ML syrup   Acute cough   Phenergan dm as needed       Relevant Medications   promethazine-dextromethorphan (PROMETHAZINE-DM) 6.25-15 MG/5ML syrup  Assessment and Plan    Upper Respiratory Tract Infection   Symptoms consistent with an upper respiratory tract infection include intermittent ear pain, nasal congestion, sinus pressure, and a scratchy throat over the past three weeks, worsening at night. Significant ear pain is noted, especially in the ear with prior surgery, along with a persistent cough that worsens when lying down. No fever is reported. Regular use of Flonase and an antihistamine is recommended to manage congestion without affecting blood pressure. She is allergic to sulfa but tolerates penicillins. Prescribe promethazine DM for nighttime cough to aid sleep, as it is believed to be more effective than codeine-based medications. Advise using Mucinex or Delsym during the day for cough management. Prescribe an antibiotic for suspected bacterial infection and provide additional Flonase for nasal congestion.  Hypertension   Blood pressure is elevated, attributed to the use of Alka-Seltzer Plus for  symptom management. She is aware that decongestants can raise blood pressure and has been using Flonase and antihistamines as alternatives. Advise against the use of decongestants like Alka-Seltzer Plus due to potential blood pressure elevation. Manage blood pressure with lifestyle modifications and medication as needed.  Work-related Stress and Fatigue   Significant stress and fatigue are due to work demands and lack of sleep from symptoms. She works in a Engineer, materials ICU rooms and has been unable to rest adequately due to symptoms and work obligations. Provide a note for work to allow for rest and recovery if needed. Encourage rest and adequate sleep to aid recovery from infection.  r  Return if symptoms worsen or fail to improve.    Donato Schultz, DO

## 2023-07-26 NOTE — Assessment & Plan Note (Signed)
 Augmentin bid x 10 days  Flonase and antihistamine  Cough syrup  Return to office as needed

## 2023-07-26 NOTE — Telephone Encounter (Signed)
 Patient needs bone density test order to  be done at [facility].

## 2023-07-29 ENCOUNTER — Other Ambulatory Visit: Payer: Self-pay | Admitting: Family Medicine

## 2023-07-29 ENCOUNTER — Other Ambulatory Visit (HOSPITAL_COMMUNITY): Payer: Self-pay

## 2023-07-29 ENCOUNTER — Telehealth: Payer: Self-pay

## 2023-07-29 ENCOUNTER — Other Ambulatory Visit: Payer: Self-pay

## 2023-07-29 DIAGNOSIS — J0140 Acute pansinusitis, unspecified: Secondary | ICD-10-CM

## 2023-07-29 MED ORDER — CEFDINIR 300 MG PO CAPS
300.0000 mg | ORAL_CAPSULE | Freq: Two times a day (BID) | ORAL | 0 refills | Status: DC
  Filled 2023-07-29: qty 14, 7d supply, fill #0

## 2023-07-29 MED ORDER — FLUCONAZOLE 150 MG PO TABS: ORAL_TABLET | ORAL | 0 refills | Status: DC

## 2023-07-29 MED ORDER — PREDNISONE 10 MG PO TABS
ORAL_TABLET | ORAL | 0 refills | Status: AC
Start: 2023-07-29 — End: 2023-08-11
  Filled 2023-07-29: qty 20, 12d supply, fill #0

## 2023-07-29 MED ORDER — CEFDINIR 300 MG PO CAPS
300.0000 mg | ORAL_CAPSULE | Freq: Two times a day (BID) | ORAL | 0 refills | Status: DC
Start: 2023-07-29 — End: 2023-10-18

## 2023-07-29 NOTE — Telephone Encounter (Signed)
 Patient advised of new rx. She asked for diflucan in case she gets yeast infection, ok per Dr. Zola Button, rx sent

## 2023-07-29 NOTE — Telephone Encounter (Signed)
 Copied from CRM [id number]. Topic: Clinical - Medical Advice >> Jul 29, 2023  8:49 AM Almira Coaster wrote: Reason for CRM: Patient was seen on 07/26/2023 and diagnosed with pansinusitis, Loreen Freud asked her to call in Monday morning to see how she was feeling. Patient is stating that she still does not feel better and had to miss work again today.

## 2023-07-29 NOTE — Telephone Encounter (Signed)
 Patient notified of change, she asked for a prescription of diflucan due to possible yeast infection from antibiotics. Also [facility] [place]/ cancelled rx at [facility]

## 2023-07-30 ENCOUNTER — Other Ambulatory Visit: Payer: Self-pay

## 2023-08-15 ENCOUNTER — Other Ambulatory Visit (HOSPITAL_COMMUNITY): Payer: Self-pay

## 2023-08-19 ENCOUNTER — Other Ambulatory Visit (HOSPITAL_COMMUNITY): Payer: Self-pay

## 2023-08-22 NOTE — Telephone Encounter (Signed)
 Copied from CRM [id number]. Topic: Referral - Status >> Aug 22, 2023  2:45 PM Erica Diaz wrote: Reason for CRM: pt called to follow up on Bone density testing. Please call pt back with a update at [phone]

## 2023-08-26 NOTE — Addendum Note (Signed)
 Addended by: Sandford Craze on: 08/26/2023 12:12 PM   Modules accepted: Orders

## 2023-09-05 DIAGNOSIS — Z1231 Encounter for screening mammogram for malignant neoplasm of breast: Secondary | ICD-10-CM | POA: Diagnosis not present

## 2023-09-05 DIAGNOSIS — R92333 Mammographic heterogeneous density, bilateral breasts: Secondary | ICD-10-CM | POA: Diagnosis not present

## 2023-09-05 LAB — HM MAMMOGRAPHY

## 2023-09-11 ENCOUNTER — Other Ambulatory Visit (HOSPITAL_COMMUNITY): Payer: Self-pay

## 2023-09-11 ENCOUNTER — Other Ambulatory Visit: Payer: Self-pay | Admitting: Family

## 2023-09-11 ENCOUNTER — Other Ambulatory Visit: Payer: Self-pay

## 2023-09-11 MED ORDER — ESTROGENS CONJUGATED 0.3 MG PO TABS
0.3000 mg | ORAL_TABLET | Freq: Every day | ORAL | 0 refills | Status: DC
  Filled 2023-09-11 – 2023-09-19 (×2): qty 90, 90d supply, fill #0

## 2023-09-12 ENCOUNTER — Other Ambulatory Visit (HOSPITAL_COMMUNITY): Payer: Self-pay

## 2023-09-19 ENCOUNTER — Other Ambulatory Visit (HOSPITAL_COMMUNITY): Payer: Self-pay

## 2023-09-19 ENCOUNTER — Other Ambulatory Visit: Payer: Self-pay

## 2023-10-15 ENCOUNTER — Other Ambulatory Visit: Payer: Self-pay | Admitting: Family

## 2023-10-15 ENCOUNTER — Other Ambulatory Visit: Payer: Self-pay

## 2023-10-15 MED ORDER — LEVOCETIRIZINE DIHYDROCHLORIDE 5 MG PO TABS
5.0000 mg | ORAL_TABLET | Freq: Every evening | ORAL | 0 refills | Status: DC
  Filled 2023-10-15: qty 90, 90d supply, fill #0

## 2023-10-16 ENCOUNTER — Other Ambulatory Visit (HOSPITAL_COMMUNITY): Payer: Self-pay

## 2023-10-16 ENCOUNTER — Other Ambulatory Visit: Payer: Self-pay

## 2023-10-16 ENCOUNTER — Encounter: Payer: Self-pay | Admitting: Pharmacist

## 2023-10-18 ENCOUNTER — Ambulatory Visit (INDEPENDENT_AMBULATORY_CARE_PROVIDER_SITE_OTHER): Admitting: Family

## 2023-10-18 ENCOUNTER — Other Ambulatory Visit: Payer: Self-pay

## 2023-10-18 ENCOUNTER — Encounter: Payer: Self-pay | Admitting: Family

## 2023-10-18 ENCOUNTER — Other Ambulatory Visit (HOSPITAL_BASED_OUTPATIENT_CLINIC_OR_DEPARTMENT_OTHER): Payer: Self-pay

## 2023-10-18 VITALS — BP 109/56 | HR 98 | Temp 98.4°F | Resp 16 | Ht 61.0 in | Wt 137.0 lb

## 2023-10-18 DIAGNOSIS — E785 Hyperlipidemia, unspecified: Secondary | ICD-10-CM | POA: Diagnosis not present

## 2023-10-18 DIAGNOSIS — Z0000 Encounter for general adult medical examination without abnormal findings: Secondary | ICD-10-CM

## 2023-10-18 DIAGNOSIS — R21 Rash and other nonspecific skin eruption: Secondary | ICD-10-CM | POA: Diagnosis not present

## 2023-10-18 DIAGNOSIS — M8580 Other specified disorders of bone density and structure, unspecified site: Secondary | ICD-10-CM | POA: Diagnosis not present

## 2023-10-18 DIAGNOSIS — B351 Tinea unguium: Secondary | ICD-10-CM

## 2023-10-18 MED ORDER — CALTRATE 600+D PLUS MINERALS 600-800 MG-UNIT PO CHEW: 1.0000 | CHEWABLE_TABLET | Freq: Two times a day (BID) | ORAL | Status: AC

## 2023-10-18 MED ORDER — TERBINAFINE HCL 250 MG PO TABS
250.0000 mg | ORAL_TABLET | Freq: Every day | ORAL | 0 refills | Status: AC
  Filled 2023-10-18 (×2): qty 30, 30d supply, fill #0

## 2023-10-18 NOTE — Patient Instructions (Signed)
 VISIT SUMMARY:  Today, you had your annual physical exam. You are in good health with no specific concerns, and your immunizations are up to date. We discussed your eye allergies and toenail fungus, and made plans to manage these issues.  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 -Start taking Lamisil tablets once daily for 12 weeks. -Schedule liver function tests after one month of starting Lamisil. -Monitor for new nail growth at the base as a sign that the treatment is working.

## 2023-10-18 NOTE — Assessment & Plan Note (Addendum)
  Chronic toenail fungus with recurrence after topical treatments. Oral Lamisil  recommended for higher efficacy. - Prescribe Lamisil  tablets for 12 weeks, once daily. - Schedule liver function tests after one month. - Monitor for healthy new nail growth at the base as a sign of efficacy.
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
## 2023-10-18 NOTE — Assessment & Plan Note (Signed)
 Likely poison ivy- stable/improving with otc ointments.

## 2023-10-18 NOTE — Assessment & Plan Note (Signed)
 Continue healthy diet and regular exercise.  Update dexa.  Mammo up to date.  Colo will be due next year. Immunizations reviewed and up to date.

## 2023-10-19 LAB — COMPREHENSIVE METABOLIC PANEL WITH GFR
AG Ratio: 1.8 (calc) (ref 1.0–2.5)
ALT: 15 U/L (ref 6–29)
AST: 19 U/L (ref 10–35)
Albumin: 4.8 g/dL (ref 3.6–5.1)
Alkaline phosphatase (APISO): 78 U/L (ref 37–153)
BUN: 12 mg/dL (ref 7–25)
CO2: 25 mmol/L (ref 20–32)
Calcium: 10 mg/dL (ref 8.6–10.4)
Chloride: 103 mmol/L (ref 98–110)
Creat: 0.86 mg/dL (ref 0.50–1.05)
Globulin: 2.7 g/dL (ref 1.9–3.7)
Glucose, Bld: 84 mg/dL (ref 65–99)
Potassium: 4.5 mmol/L (ref 3.5–5.3)
Sodium: 138 mmol/L (ref 135–146)
Total Bilirubin: 0.6 mg/dL (ref 0.2–1.2)
Total Protein: 7.5 g/dL (ref 6.1–8.1)
eGFR: 76 mL/min/{1.73_m2} (ref >=60)

## 2023-10-19 LAB — HEPATIC FUNCTION PANEL
AG Ratio: 1.6 (calc) (ref 1.0–2.5)
ALT: 15 U/L (ref 6–29)
AST: 18 U/L (ref 10–35)
Albumin: 4.7 g/dL (ref 3.6–5.1)
Alkaline phosphatase (APISO): 80 U/L (ref 37–153)
Bilirubin, Direct: 0.1 mg/dL (ref 0.0–0.2)
Globulin: 2.9 g/dL (ref 1.9–3.7)
Indirect Bilirubin: 0.5 mg/dL (ref 0.2–1.2)
Total Bilirubin: 0.6 mg/dL (ref 0.2–1.2)
Total Protein: 7.6 g/dL (ref 6.1–8.1)

## 2023-10-19 LAB — LIPID PANEL
Cholesterol: 235 mg/dL — ABNORMAL HIGH (ref <200)
HDL: 97 mg/dL (ref >=50)
LDL Cholesterol (Calc): 122 mg/dL — ABNORMAL HIGH
Non-HDL Cholesterol (Calc): 138 mg/dL — ABNORMAL HIGH (ref <130)
Total CHOL/HDL Ratio: 2.4 (calc) (ref <5.0)
Triglycerides: 65 mg/dL (ref <150)

## 2023-10-20 ENCOUNTER — Ambulatory Visit: Payer: Self-pay | Admitting: Family

## 2023-11-12 ENCOUNTER — Telehealth: Payer: Self-pay | Admitting: Family

## 2023-11-12 DIAGNOSIS — B351 Tinea unguium: Secondary | ICD-10-CM

## 2023-11-12 NOTE — Telephone Encounter (Signed)
 Orders placed.

## 2023-11-12 NOTE — Telephone Encounter (Signed)
 Good morning I need lab orders for this person

## 2023-11-15 ENCOUNTER — Other Ambulatory Visit

## 2023-11-19 ENCOUNTER — Other Ambulatory Visit

## 2023-12-12 ENCOUNTER — Other Ambulatory Visit: Payer: Self-pay | Admitting: Family

## 2023-12-12 ENCOUNTER — Other Ambulatory Visit (HOSPITAL_COMMUNITY): Payer: Self-pay

## 2023-12-12 MED ORDER — ESTROGENS CONJUGATED 0.3 MG PO TABS
0.3000 mg | ORAL_TABLET | Freq: Every day | ORAL | 3 refills | Status: AC
  Filled 2023-12-12 – 2023-12-19 (×2): qty 90, 90d supply, fill #0
  Filled 2024-03-13: qty 90, 90d supply, fill #1
  Filled 2024-06-24: qty 90, 90d supply, fill #2

## 2023-12-12 MED ORDER — LEVOCETIRIZINE DIHYDROCHLORIDE 5 MG PO TABS
5.0000 mg | ORAL_TABLET | Freq: Every evening | ORAL | 3 refills | Status: AC
  Filled 2023-12-12 – 2023-12-31 (×3): qty 90, 90d supply, fill #0
  Filled 2024-04-14 (×3): qty 90, 90d supply, fill #1

## 2023-12-19 ENCOUNTER — Other Ambulatory Visit: Payer: Self-pay

## 2023-12-19 ENCOUNTER — Other Ambulatory Visit (HOSPITAL_COMMUNITY): Payer: Self-pay

## 2023-12-31 ENCOUNTER — Other Ambulatory Visit (HOSPITAL_COMMUNITY): Payer: Self-pay

## 2023-12-31 ENCOUNTER — Other Ambulatory Visit: Payer: Self-pay

## 2024-02-27 ENCOUNTER — Encounter: Payer: Self-pay | Admitting: Family

## 2024-03-08 ENCOUNTER — Encounter: Payer: Self-pay | Admitting: Family

## 2024-03-13 ENCOUNTER — Other Ambulatory Visit (HOSPITAL_COMMUNITY): Payer: Self-pay

## 2024-04-14 ENCOUNTER — Other Ambulatory Visit (HOSPITAL_COMMUNITY): Payer: Self-pay

## 2024-04-22 ENCOUNTER — Other Ambulatory Visit (HOSPITAL_COMMUNITY): Payer: Self-pay

## 2024-06-24 ENCOUNTER — Other Ambulatory Visit (HOSPITAL_COMMUNITY): Payer: Self-pay

## 2024-10-20 ENCOUNTER — Encounter: Admitting: Family
